# Patient Record
Sex: Female | Born: 1995 | Race: White | Hispanic: No | Marital: Single | Smoking: Never smoker
Health system: Southern US, Community
[De-identification: ages and names within clinical notes are randomized; demographics above are authoritative.]

## PROBLEM LIST (undated history)

## (undated) DIAGNOSIS — N390 Urinary tract infection, site not specified: Secondary | ICD-10-CM

## (undated) DIAGNOSIS — E669 Obesity, unspecified: Secondary | ICD-10-CM

## (undated) DIAGNOSIS — N920 Excessive and frequent menstruation with regular cycle: Secondary | ICD-10-CM

## (undated) HISTORY — DX: Urinary tract infection, site not specified: N39.0

## (undated) HISTORY — PX: TONSILECTOMY, ADENOIDECTOMY, BILATERAL MYRINGOTOMY AND TUBES: SHX2538

## (undated) HISTORY — DX: Obesity, unspecified: E66.9

## (undated) HISTORY — DX: Excessive and frequent menstruation with regular cycle: N92.0

---
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
## 2014-06-07 LAB — COMPREHENSIVE METABOLIC PANEL
Albumin: 3.5 g/dL — ABNORMAL LOW (ref 3.8–5.6)
Alkaline Phosphatase: 67 U/L
Anion Gap: 9 (ref 7–16)
BUN: 12 mg/dL (ref 7–18)
Bilirubin,Total: 0.5 mg/dL (ref 0.2–1.0)
CALCIUM: 8.7 mg/dL — AB (ref 9.0–10.7)
CO2: 23 mmol/L (ref 21–32)
CREATININE: 1.11 mg/dL (ref 0.60–1.30)
Chloride: 106 mmol/L (ref 98–107)
EGFR (African American): >60
GLUCOSE: 84 mg/dL (ref 65–99)
OSMOLALITY: 275 (ref 275–301)
Potassium: 4.1 mmol/L (ref 3.5–5.1)
SGOT(AST): 25 U/L (ref 0–26)
SGPT (ALT): 18 U/L
SODIUM: 138 mmol/L (ref 136–145)
Total Protein: 7.1 g/dL (ref 6.4–8.6)

## 2014-06-07 LAB — CBC
HCT: 41.1 % (ref 35.0–47.0)
HGB: 13.3 g/dL (ref 12.0–16.0)
MCH: 27.6 pg (ref 26.0–34.0)
MCHC: 32.3 g/dL (ref 32.0–36.0)
MCV: 85 fL (ref 80–100)
Platelet: 295 10*3/uL (ref 150–440)
RBC: 4.81 10*6/uL (ref 3.80–5.20)
RDW: 14.1 % (ref 11.5–14.5)
WBC: 17.5 10*3/uL — AB (ref 3.6–11.0)

## 2014-06-08 ENCOUNTER — Ambulatory Visit: Payer: Self-pay | Admitting: Urology

## 2014-06-08 ENCOUNTER — Inpatient Hospital Stay: Payer: Self-pay | Admitting: Specialist

## 2014-06-08 LAB — URINALYSIS, COMPLETE
BILIRUBIN, UR: NEGATIVE
Glucose,UR: NEGATIVE mg/dL (ref 0–75)
KETONE: NEGATIVE
Leukocyte Esterase: NEGATIVE
Nitrite: POSITIVE
Ph: 5 (ref 4.5–8.0)
Protein: NEGATIVE
RBC,UR: 13 /HPF (ref 0–5)
SPECIFIC GRAVITY: 1.018 (ref 1.003–1.030)
Squamous Epithelial: 1

## 2014-06-09 LAB — BASIC METABOLIC PANEL
ANION GAP: 5 — AB (ref 7–16)
BUN: 8 mg/dL (ref 7–18)
CHLORIDE: 109 mmol/L — AB (ref 98–107)
Calcium, Total: 8.8 mg/dL — ABNORMAL LOW (ref 9.0–10.7)
Co2: 26 mmol/L (ref 21–32)
Creatinine: 1 mg/dL (ref 0.60–1.30)
EGFR (African American): >60
EGFR (Non-African Amer.): >60
GLUCOSE: 96 mg/dL (ref 65–99)
Osmolality: 278 (ref 275–301)
Potassium: 3.8 mmol/L (ref 3.5–5.1)
Sodium: 140 mmol/L (ref 136–145)

## 2014-06-09 LAB — CBC WITH DIFFERENTIAL/PLATELET
BASOS ABS: 0.1 10*3/uL (ref 0.0–0.1)
Basophil %: 0.7 %
EOS PCT: 3.9 %
Eosinophil #: 0.3 10*3/uL (ref 0.0–0.7)
HCT: 36.7 % (ref 35.0–47.0)
HGB: 11.9 g/dL — ABNORMAL LOW (ref 12.0–16.0)
Lymphocyte #: 3 10*3/uL (ref 1.0–3.6)
Lymphocyte %: 37 %
MCH: 28 pg (ref 26.0–34.0)
MCHC: 32.5 g/dL (ref 32.0–36.0)
MCV: 86 fL (ref 80–100)
Monocyte #: 0.4 x10 3/mm (ref 0.2–0.9)
Monocyte %: 5.5 %
NEUTROS PCT: 52.9 %
Neutrophil #: 4.3 10*3/uL (ref 1.4–6.5)
Platelet: 247 10*3/uL (ref 150–440)
RBC: 4.26 10*6/uL (ref 3.80–5.20)
RDW: 14.3 % (ref 11.5–14.5)
WBC: 8.1 10*3/uL (ref 3.6–11.0)

## 2014-06-09 LAB — MAGNESIUM: MAGNESIUM: 1.7 mg/dL — AB

## 2014-06-09 LAB — URINE CULTURE

## 2014-08-05 ENCOUNTER — Encounter: Payer: Self-pay | Admitting: Family Medicine

## 2014-08-05 ENCOUNTER — Ambulatory Visit (INDEPENDENT_AMBULATORY_CARE_PROVIDER_SITE_OTHER): Payer: Self-pay | Admitting: Family Medicine

## 2014-08-05 ENCOUNTER — Encounter (INDEPENDENT_AMBULATORY_CARE_PROVIDER_SITE_OTHER): Payer: Self-pay

## 2014-08-05 VITALS — BP 124/74 | HR 95 | Temp 98.0°F | Ht 65.5 in | Wt 283.5 lb

## 2014-08-05 DIAGNOSIS — L709 Acne, unspecified: Secondary | ICD-10-CM

## 2014-08-05 MED ORDER — MINOCYCLINE HCL 50 MG PO CAPS: 50.0000 mg | ORAL_CAPSULE | Freq: Two times a day (BID) | ORAL | Status: DC

## 2014-08-05 MED ORDER — PHENTERMINE HCL 15 MG PO CAPS: 15.0000 mg | ORAL_CAPSULE | ORAL | Status: DC

## 2014-08-05 NOTE — Progress Notes (Signed)
Pre visit review using our clinic review tool, if applicable. No additional management support is needed unless otherwise documented below in the visit note. 

## 2014-08-05 NOTE — Assessment & Plan Note (Signed)
Persistent. Will refer to dermatology. Start minocycline 50 mg twice daily. The patient indicates understanding of these issues and agrees with the plan.

## 2014-08-05 NOTE — Assessment & Plan Note (Signed)
Discussed weight loss plan. She is declining nutritionist referral at this time. Pt would also like to discuss medication options- discussed phentermine risk benefits, side effects including HTN, pulmonary HTN, stroke.    She would like to start phentermine and lifestyle changes.   Has IUD.  Follow up in 1 month.  If BMI < 27 will decrease to half dose x 1 month then stop

## 2014-08-05 NOTE — Patient Instructions (Addendum)
Good to see you. We are starting minocycline 50 mg twice daily.  Please stop by to see Shirlee LimerickMarion on your way out to set up your dermatology referral.  We are starting phentermine 15 mg daily- please come see me in one month.

## 2014-08-05 NOTE — Progress Notes (Signed)
   Subjective:   Patient ID: Sheena Tapia, female    DOB: 1996-08-31, 18 y.o.   MRN: [id number]  Sheena Tapia is a pleasant 18 y.o. year old female who presents to clinic today with Annual Exam  on 08/05/2014  HPI: 18 yo pleasant female here to establish care.  Acne- has really effected her quality of life.  Only on her face, started when she had implanon.  Now has IUD, has improved a little but not much.  She could not tolerate topical retin-A- caused skin dryness.  Does not feel skin is that oily- using OTC facial washes.   Obesity- lifelong issue for her.  She is working on her diet, trying to increase physical activity but has not seen much success.  No current outpatient prescriptions on file prior to visit.   No current facility-administered medications on file prior to visit.    Allergies  Allergen Reactions  . Penicillins Rash    Past Medical History  Diagnosis Date  . Childhood obesity   . Menorrhagia     Past Surgical History  Procedure Laterality Date  . Tonsilectomy, adenoidectomy, bilateral myringotomy and tubes      Family History  Problem Relation Age of Onset  . Diabetes Maternal Grandfather     History   Social History  . Marital Status: Single    Spouse Name: N/A    Number of Children: N/A  . Years of Education: N/A   Occupational History  . Not on file.   Social History Main Topics  . Smoking status: Never Smoker   . Smokeless tobacco: Not on file  . Alcohol Use: Not on file  . Drug Use: Not on file  . Sexual Activity: Not on file   Other Topics Concern  . Not on file   Social History Narrative   Lives with mom, carolyn Treichler.   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   The PMH, PSH, Social History, Family History, Medications, and allergies have been [facility] in [facility], and have been updated if relevant.   Review of Systems  Constitutional: Negative.   HENT: Negative.   Eyes: Negative.   Respiratory: Negative.   Cardiovascular:  Negative.   Gastrointestinal: Negative.   Musculoskeletal: Negative.   Hematological: Negative.   Psychiatric/Behavioral: Negative.   All other systems reviewed and are negative.      Objective:     Physical Exam  Constitutional: She appears well-developed and well-nourished.  obese  HENT:  Head: Normocephalic.  Cardiovascular: Normal rate.   Pulmonary/Chest: Effort normal.  Musculoskeletal: Normal range of motion.  Skin:  +acne, cystic  Psychiatric: She has a normal mood and affect. Her behavior is normal. Judgment and thought content normal.  Nursing note and vitals reviewed.  BP 124/74 mmHg  Pulse 95  Temp(Src) 98 F (36.7 C) (Oral)  Ht 5' 5.5" (1.664 m)  Wt 283 lb 8 oz (128.595 kg)  BMI 46.44 kg/m2  SpO2 98%        Assessment & Plan:   Acne, unspecified acne type - Plan: Ambulatory referral to Dermatology No Follow-up on file.

## 2014-09-02 ENCOUNTER — Ambulatory Visit (INDEPENDENT_AMBULATORY_CARE_PROVIDER_SITE_OTHER): Payer: 59 | Admitting: Family Medicine

## 2014-09-02 ENCOUNTER — Encounter: Payer: Self-pay | Admitting: Family Medicine

## 2014-09-02 ENCOUNTER — Encounter: Payer: BC Managed Care – PPO | Admitting: Family Medicine

## 2014-09-02 DIAGNOSIS — L709 Acne, unspecified: Secondary | ICD-10-CM

## 2014-09-02 MED ORDER — MINOCYCLINE HCL 50 MG PO CAPS: 50.0000 mg | ORAL_CAPSULE | Freq: Two times a day (BID) | ORAL | Status: DC

## 2014-09-02 MED ORDER — PHENTERMINE HCL 30 MG PO CAPS: 30.0000 mg | ORAL_CAPSULE | ORAL | Status: DC

## 2014-09-02 NOTE — Progress Notes (Signed)
Pre visit review using our clinic review tool, if applicable. No additional management support is needed unless otherwise documented below in the visit note. 

## 2014-09-02 NOTE — Assessment & Plan Note (Signed)
Improved.   Continue minocyclin as directed.

## 2014-09-02 NOTE — Progress Notes (Signed)
Subjective:   Patient ID: Sheena Tapia, female    DOB: February 28, 1996, 18 y.o.   MRN: [id number]  Sheena Tapia is a pleasant 18 y.o. year old female who presents to clinic today with Follow-up  on 09/02/2014  HPI:  Established care with me last month.  Acne- has really effected her quality of life.  Only on her face, started when she had implanon.  Now has IUD, has improved a little but not much.  She could not tolerate topical retin-A- caused skin dryness.  Started minocycline twice daily last month and she has noticed a significant difference in her skin.  Obesity- lifelong issue for her.  She is working on her diet, trying to increase physical activity but has not seen much success because she works all day and going to school at night.  We started phentermine 15 mg daily one month ago.  For first two weeks, notice an improvement in her appetite but since then, she has not felt it has helped.  Has unfortunately actually gained weight. Denies any CP, HA, blurred vision, SOB, palpitations or insomnia.   Current Outpatient Prescriptions on File Prior to Visit  Medication Sig Dispense Refill  . levonorgestrel (MIRENA) 20 MCG/24HR IUD 1 each by Intrauterine route once.     No current facility-administered medications on file prior to visit.    Allergies  Allergen Reactions  . Penicillins Rash    Past Medical History  Diagnosis Date  . Childhood obesity   . Menorrhagia     Past Surgical History  Procedure Laterality Date  . Tonsilectomy, adenoidectomy, bilateral myringotomy and tubes      Family History  Problem Relation Age of Onset  . Diabetes Maternal Grandfather     History   Social History  . Marital Status: Single    Spouse Name: N/A    Number of Children: N/A  . Years of Education: N/A   Occupational History  . Not on file.   Social History Main Topics  . Smoking status: Never Smoker   . Smokeless tobacco: Not on file  . Alcohol Use: Not on file  . Drug  Use: Not on file  . Sexual Activity: Not on file   Other Topics Concern  . Not on file   Social History Narrative   Lives with mom, carolyn Knecht.   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   The PMH, PSH, Social History, Family History, Medications, and allergies [facility], and have been updated if relevant.   Review of Systems  Constitutional: Negative.   HENT: Negative.   Eyes: Negative.   Respiratory: Negative.   Cardiovascular: Negative.   Gastrointestinal: Negative.   Musculoskeletal: Negative.   Hematological: Negative.   Psychiatric/Behavioral: Negative.   All other systems reviewed and are negative.      Objective:     Physical Exam  Constitutional: She is oriented to person, place, and time. She appears well-developed and well-nourished.  obese  HENT:  Head: Normocephalic.  Cardiovascular: Normal rate.   Pulmonary/Chest: Effort normal.  Musculoskeletal: Normal range of motion.  Neurological: She is alert and oriented to person, place, and time. Coordination normal.  Skin: Skin is warm and dry.  +acne, but far less than 1 month ago  Psychiatric: She has a normal mood and affect. Her behavior is normal. Judgment and thought content normal.  Nursing note and vitals reviewed.  BP 114/74 mmHg  Pulse 85  Temp(Src) 98.1 F (36.7 C) (Oral)  Wt 287 lb (130.182 kg)  SpO2 97%        Assessment & Plan:   Severe obesity (BMI >= 40)  Acne, unspecified acne type No Follow-up on file.

## 2014-09-02 NOTE — Assessment & Plan Note (Signed)
Deteriorated. >25 minutes spent in face to face time with patient, >50% spent in counselling or coordination of care We discussed how important her portion sizes are and limiting snacking along with making time for exercise.  She is discouraged about her weight currently but wants to try harder.  She wants to try to increase phentermine to 30 mg daily and follow up in 1 month. She does not feel she has time to see a nutritionist currently since she is working and going to school.

## 2014-09-02 NOTE — Patient Instructions (Signed)
Great to see you. Try to wake up even earlier to exercise for 30 minutes.  Please come see me in one month.

## 2014-10-07 ENCOUNTER — Ambulatory Visit: Payer: 59 | Admitting: Family Medicine

## 2014-11-04 ENCOUNTER — Encounter: Payer: Self-pay | Admitting: Family Medicine

## 2014-11-04 ENCOUNTER — Ambulatory Visit (INDEPENDENT_AMBULATORY_CARE_PROVIDER_SITE_OTHER): Payer: 59 | Admitting: Family Medicine

## 2014-11-04 VITALS — BP 120/80 | HR 89 | Temp 98.6°F | Ht 65.5 in | Wt 285.8 lb

## 2014-11-04 DIAGNOSIS — J029 Acute pharyngitis, unspecified: Secondary | ICD-10-CM

## 2014-11-04 LAB — POCT RAPID STREP A (OFFICE): Rapid Strep A Screen: NEGATIVE

## 2014-11-04 MED ORDER — CEFDINIR 300 MG PO CAPS: 600.0000 mg | ORAL_CAPSULE | Freq: Every day | ORAL | Status: DC

## 2014-11-04 NOTE — Progress Notes (Signed)
Pre visit review using our clinic review tool, if applicable. No additional management support is needed unless otherwise documented below in the visit note. 

## 2014-11-04 NOTE — Progress Notes (Signed)
   Dr. Karleen HampshireSpencer T. Viktoria Gruetzmacher, MD, CAQ Sports Medicine Primary [facility] and Sports Medicine [address] [place], [id number] Phone: [phone] Fax: [phone]  11/04/2014  Patient: Sheena Tapia, MRN: [id number], DOB: [birth year], 19 y.o.  Primary Physician:  Ruthe Mannanalia Aron, MD  Chief Complaint: Sore Throat and Shortness of Breath  Subjective:   Sheena Tapia is a 19 y.o. very pleasant female patient who presents with the following:  Pleasant young woman who presents with a acute sore throat.  She was actually treated with a ten-day course of penicillin V quite recently, and took her last day of antibiotics 3 days ago.  She has had a return of her sore throat.  When she was initially evaluated for this she did have a confirmed positive strep test.  Past Medical History, Surgical History, Social History, Family History, Problem List, Medications, and Allergies have been reviewed and updated if relevant.  ROS: GEN: Acute illness details above GI: Tolerating PO intake GU: maintaining adequate hydration and urination Pulm: No SOB Interactive and getting along well at home.  Otherwise, ROS is as per the HPI.   Objective:   BP 120/80 mmHg  Pulse 89  Temp(Src) 98.6 F (37 C) (Oral)  Ht 5' 5.5" (1.664 m)  Wt 285 lb 12 oz (129.615 kg)  BMI 46.81 kg/m2   Gen: WDWN, NAD; A & O x3, cooperative. Pleasant.Globally Non-toxic HEENT: Normocephalic and atraumatic. Throat: normal R TM clear, L TM - good landmarks, No fluid present. rhinnorhea. No frontal or maxillary sinus T. MMM NECK: Anterior cervical  LAD is present - TTP mildly CV: RRR, No M/G/R, cap refill <2 sec PULM: Breathing comfortably in no respiratory distress. no wheezing, crackles, rhonchi ABD: S,NT,ND,+BS. No HSM. No rebound. EXT: No c/c/e PSYCH: Friendly, good eye contact    Laboratory and Imaging Data: Results for orders placed or performed in visit on 11/04/14  POCT rapid strep A  Result Value Ref Range   Rapid Strep  A Screen Negative Negative     Assessment and Plan:   Sore throat - Plan: POCT rapid strep A  In a patient with recent diagnosis of strep, confirmed, extremely high pretest probability for treatment failure streptococcal pharyngitis.  Treat with Omnicef.  Our negative strep test means very little, given that the patient 2 or 3 days ago was on penicillin.  Follow-up: Return if symptoms worsen or fail to improve.  New Prescriptions   CEFDINIR (OMNICEF) 300 MG CAPSULE    Take 2 capsules (600 mg total) by mouth daily.   Orders Placed This Encounter  Procedures  . POCT rapid strep A    Signed,  Jaqulyn Chancellor T. Otis Portal, MD   Patient's Medications  New Prescriptions   CEFDINIR (OMNICEF) 300 MG CAPSULE    Take 2 capsules (600 mg total) by mouth daily.  Previous Medications   LEVONORGESTREL (MIRENA) 20 MCG/24HR IUD    1 each by Intrauterine route once.   PHENTERMINE 30 MG CAPSULE    Take 1 capsule (30 mg total) by mouth every morning.  Modified Medications   No medications on file  Discontinued Medications   MINOCYCLINE (MINOCIN,DYNACIN) 50 MG CAPSULE    Take 1 capsule (50 mg total) by mouth 2 (two) times daily.

## 2014-12-05 ENCOUNTER — Ambulatory Visit: Payer: 59 | Admitting: Family Medicine

## 2014-12-24 ENCOUNTER — Encounter: Payer: Self-pay | Admitting: Family Medicine

## 2014-12-24 ENCOUNTER — Ambulatory Visit (INDEPENDENT_AMBULATORY_CARE_PROVIDER_SITE_OTHER): Payer: 59 | Admitting: Family Medicine

## 2014-12-24 VITALS — BP 116/70 | HR 88 | Temp 97.9°F | Wt 277.5 lb

## 2014-12-24 DIAGNOSIS — R5383 Other fatigue: Secondary | ICD-10-CM | POA: Insufficient documentation

## 2014-12-24 DIAGNOSIS — R42 Dizziness and giddiness: Secondary | ICD-10-CM

## 2014-12-24 LAB — CBC WITH DIFFERENTIAL/PLATELET
Basophils Absolute: 0.1 10*3/uL (ref 0.0–0.1)
Basophils Relative: 0.6 % (ref 0.0–3.0)
Eosinophils Absolute: 0.3 10*3/uL (ref 0.0–0.7)
Eosinophils Relative: 2.4 % (ref 0.0–5.0)
HCT: 44 % (ref 36.0–46.0)
HEMOGLOBIN: 14.8 g/dL (ref 12.0–15.0)
Lymphocytes Relative: 30.2 % (ref 12.0–46.0)
Lymphs Abs: 3.5 10*3/uL (ref 0.7–4.0)
MCHC: 33.8 g/dL (ref 30.0–36.0)
MCV: 84.9 fl (ref 78.0–100.0)
Monocytes Absolute: 0.6 10*3/uL (ref 0.1–1.0)
Monocytes Relative: 5 % (ref 3.0–12.0)
Neutro Abs: 7.1 10*3/uL (ref 1.4–7.7)
Neutrophils Relative %: 61.8 % (ref 43.0–77.0)
PLATELETS: 310 10*3/uL (ref 150.0–400.0)
RBC: 5.18 Mil/uL — AB (ref 3.87–5.11)
RDW: 13.4 % (ref 11.5–14.6)
WBC: 11.5 10*3/uL — ABNORMAL HIGH (ref 4.5–10.5)

## 2014-12-24 LAB — COMPREHENSIVE METABOLIC PANEL
ALK PHOS: 56 U/L (ref 39–117)
ALT: 15 U/L (ref 0–35)
AST: 17 U/L (ref 0–37)
Albumin: 4.3 g/dL (ref 3.5–5.2)
BILIRUBIN TOTAL: 0.5 mg/dL (ref 0.2–1.2)
BUN: 11 mg/dL (ref 6–23)
CO2: 28 meq/L (ref 19–32)
CREATININE: 0.9 mg/dL (ref 0.40–1.20)
Calcium: 9.6 mg/dL (ref 8.4–10.5)
Chloride: 103 mEq/L (ref 96–112)
GFR: 84.45 mL/min (ref >60.00)
Glucose, Bld: 69 mg/dL — ABNORMAL LOW (ref 70–99)
POTASSIUM: 4.2 meq/L (ref 3.5–5.1)
Sodium: 137 mEq/L (ref 135–145)
TOTAL PROTEIN: 7.5 g/dL (ref 6.0–8.3)

## 2014-12-24 LAB — HEMOGLOBIN A1C: HEMOGLOBIN A1C: 4.9 % (ref 4.6–6.5)

## 2014-12-24 LAB — TSH: TSH: 1.99 u[IU]/mL (ref 0.35–5.50)

## 2014-12-24 LAB — VITAMIN B12: VITAMIN B 12: 190 pg/mL — AB (ref 211–911)

## 2014-12-24 NOTE — Assessment & Plan Note (Signed)
New- intermittent.  She does take high risk medications - phentermine, IUD. I did advise that she take a drug holiday from phentermine to see if her symptoms change. Once we have lab work results, will reassess and discuss what further work up should be done next. The patient indicates understanding of these issues and agrees with the plan.

## 2014-12-24 NOTE — Patient Instructions (Signed)
Good to see you. We will call you with your lab results.   

## 2014-12-24 NOTE — Progress Notes (Signed)
Subjective:   Patient ID: Sheena Tapia, female    DOB: 31-Jul-1996, 18 y.o.   MRN: [id number]  Sheena Tapia is a pleasant 19 y.o. year old female who presents to clinic today with Fatigue  on 12/24/2014  HPI:  Here for several months (? 6) of fatigue and intermittent dizziness.  Currently taking phentermine but she feels her symptoms are unrelated and actually she feels less tired since she started taking it.  Dizziness seems to only occur when she goes long periods of time without eating.  She has a family history of diabetes and is concerned that she may be developing diabetes.  No syncope.  No CP.  No recent illnesses.  Current Outpatient Prescriptions on File Prior to Visit  Medication Sig Dispense Refill  . levonorgestrel (MIRENA) 20 MCG/24HR IUD 1 each by Intrauterine route once.    . phentermine 30 MG capsule Take 1 capsule (30 mg total) by mouth every morning. 30 capsule 0   No current facility-administered medications on file prior to visit.    No Known Allergies  Past Medical History  Diagnosis Date  . Childhood obesity   . Menorrhagia     Past Surgical History  Procedure Laterality Date  . Tonsilectomy, adenoidectomy, bilateral myringotomy and tubes      Family History  Problem Relation Age of Onset  . Diabetes Maternal Grandfather     History   Social History  . Marital Status: Single    Spouse Name: N/A  . Number of Children: N/A  . Years of Education: N/A   Occupational History  . Not on file.   Social History Main Topics  . Smoking status: Never Smoker   . Smokeless tobacco: Never Used  . Alcohol Use: No  . Drug Use: No  . Sexual Activity: Not on file   Other Topics Concern  . Not on file   Social History Narrative   Lives with mom, carolyn Olmo.   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   The PMH, PSH, Social History, Family History, Medications, and allergies [facility], and have been updated  if relevant.   Review of Systems  Constitutional: Positive for fatigue. Negative for fever.  HENT: Negative.   Eyes: Negative.   Respiratory: Negative.   Cardiovascular: Negative.   Gastrointestinal: Negative.   Endocrine: Negative.   Genitourinary: Negative.   Musculoskeletal: Negative.   Skin: Negative.   Allergic/Immunologic: Negative.   Neurological: Positive for dizziness and light-headedness. Negative for tremors, seizures, syncope, facial asymmetry, speech difficulty, weakness, numbness and headaches.  Hematological: Negative for adenopathy.  Psychiatric/Behavioral: Negative.   All other systems reviewed and are negative.      Objective:    BP 116/70 mmHg  Pulse 105  Temp(Src) 97.9 F (36.6 C) (Oral)  Wt 277 lb 8 oz (125.873 kg)  SpO2 97% Wt Readings from Last 3 Encounters:  12/24/14 277 lb 8 oz (125.873 kg)  11/04/14 285 lb 12 oz (129.615 kg)  09/02/14 287 lb (130.182 kg)     Physical Exam  Constitutional: She is oriented to person, place, and time. She appears well-developed and well-nourished. No distress.  obese  HENT:  Head: Normocephalic.  Eyes: Conjunctivae are normal.  Neck: Normal range of motion. Neck supple. No thyromegaly present.  Cardiovascular: Normal rate and regular rhythm.   Pulmonary/Chest: Effort normal and breath sounds normal. No respiratory distress.  Musculoskeletal: She exhibits no edema.  Lymphadenopathy:  She has no cervical adenopathy.  Neurological: She is alert and oriented to person, place, and time. No cranial nerve deficit.  Skin: Skin is warm and dry.  Psychiatric: She has a normal mood and affect. Her behavior is normal. Judgment and thought content normal.  Nursing note and vitals reviewed.         Assessment & Plan:   Other fatigue No Follow-up on file.

## 2014-12-24 NOTE — Progress Notes (Signed)
Pre visit review using our clinic review tool, if applicable. No additional management support is needed unless otherwise documented below in the visit note. 

## 2014-12-24 NOTE — Assessment & Plan Note (Signed)
New problem but has been ongoing for past several months. Advised that she needs to start exercising and that increased physical activity and weight loss will improve her energy level. I do agree that we do need to do some blood work to rule out other possible contributing factors.  Orders Placed This Encounter  Procedures  . TSH  . CBC with Differential/Platelet  . Comprehensive metabolic panel  . Hemoglobin A1c  . Vitamin B12

## 2015-01-18 NOTE — Consult Note (Signed)
Admit Diagnosis:   PYELONEPHRITIS: Onset Date: 08-Jun-2014, Status: Active, Description: PYELONEPHRITIS      Admit Reason:   Pyelonephritis (590.80): Status: Active, Coding System: ICD9, Coded Name: Pyelonephritis, unspecified  Home Medications: Medication Status  patient takes no medicine Active   Lab Results: Hepatic:  11-Sep-15 21:28   Bilirubin, Total 0.5  Alkaline Phosphatase 67 (46-116 NOTE: New Reference Range 04/16/14)  SGPT (ALT) 18 (14-63 NOTE: New Reference Range 04/16/14)  SGOT (AST) 25  Total Protein, Serum 7.1  Albumin, Serum  3.5  Routine Micro:  11-Sep-15 21:05   Micro Text Report URINE CULTURE   COMMENT                   COLONIES TOO SMALL TO READ   ANTIBIOTIC                       Specimen Source CLEAN CATCH  Culture Comment COLONIES TOO SMALL TO READ  Result(s) reported on 08 Jun 2014 at 11:15AM.  Routine Chem:  11-Sep-15 21:05   Result Comment UA - CANCEL/QNS ON SPECIMEN/NEEDS RECOLLECT  - C/RAQUEL DAVID/2200/06-07-14/RWW  Result(s) reported on 07 Jun 2014 at 10:00PM.    21:28   Glucose, Serum 84  BUN 12  Creatinine (comp) 1.11  Sodium, Serum 138  Potassium, Serum 4.1  Chloride, Serum 106  CO2, Serum 23  Calcium (Total), Serum  8.7  Osmolality (calc) 275  eGFR (African American) >60  eGFR (Non-African American) >60 (eGFR values <26mL/min/1.73 m2 may be an indication of chronic kidney disease (CKD). Calculated eGFR is useful in patients with stable renal function. The eGFR calculation will not be reliable in acutely ill patients when serum creatinine is changing rapidly. It is not useful in  patients on dialysis. The eGFR calculation may not be applicable to patients at the low and high extremes of body sizes, pregnant women, and vegetarians.)  Result Comment POTASSIUM/AST/CREATININE - Slight hemolysis, interpret results with  - caution.  Result(s) reported on 07 Jun 2014 at 09:55PM.  Anion Gap 9  Routine UA:  11-Sep-15 21:05    Color (UA) -  Clarity (UA) -  Glucose (UA) -  Bilirubin (UA) -  Ketones (UA) -  Specific Gravity (UA) -  Blood (UA) -  pH (UA) -  Protein (UA) -  Nitrite (UA) -  Leukocyte Esterase (UA) -  RBC (UA) -  WBC (UA) -  Bacteria (UA) -  Epithelial Cells (UA) -  Mucous (UA) -  Other 1 (UA) -  Other 2 (UA) -  Other 3 (UA) -  Other 4 (UA) -  Other 5 (UA) -  Other 6 (UA) - (Result(s) reported on 07 Jun 2014 at 10:00PM.)  Transitional Epithelial (UA) -  Renal Epithelial (UA) -  WBC Clump (UA) -  Dysmorphic Red Blood Cell (UA) -  Red Blood Cell Clump (UA) -  Trichomonas (UA) -  Budding Yeast (UA) -  Hyphae Yeast -  Hyaline Cast (UA) -  Granular Cast (UA) -  Epithelial Cast (UA) -  WBC Cast (UA) -  RBC Cast (UA) -  Cellular Cast (UA) -  Broad Cast (UA) -  Waxy Cast (UA) -  Fatty Cast (UA) -  Amorphous Crystal (UA) -  Triple Phosphate Crystal (UA) -  Calcium Oxalate Crystal (UA) -  Uric Acid Crystal (UA) -  Calcium Phosphate Crystal (UA) -  Calcium Carbonate Crystal (UA) -  Cystine Crystal (UA) -  Leucine Crystal (UA) -  Tyrosine  Crystal (UA) -  Fat (UA) -  Oval Fat Body (UA) -  Routine Hem:  11-Sep-15 21:28   WBC (CBC)  17.5  RBC (CBC) 4.81  Hemoglobin (CBC) 13.3  Hematocrit (CBC) 41.1  Platelet Count (CBC) 295 (Result(s) reported on 07 Jun 2014 at 09:48PM.)  MCV 85  MCH 27.6  MCHC 32.3  RDW 14.1   Radiology Results:  Radiology Results: CT:    12-Sep-15 03:04, CT Abdomen Pelvis WO for Stone  CT Abdomen Pelvis WO for Stone  REASON FOR EXAM:    L flank pain, N/V, leukocytosis, concerned for   possibility of infected stone  COMMENTS:       PROCEDURE: CT  - CT ABDOMEN /PELVIS WO (STONE)  - Jun 08 2014  3:04AM     CLINICAL DATA:  Left lower quadrant flank pain, nausea and vomiting.  Leukocytosis.    EXAM:  CT ABDOMEN AND PELVIS WITHOUT CONTRAST    TECHNIQUE:  Multidetector CT imaging of the abdomen and pelvis was performed  following the standard  protocol without IV contrast.  COMPARISON:  None.    FINDINGS:  The visualized lung bases are clear.    The liver and spleen are unremarkable in appearance. The gallbladder  is within normal limits. The pancreas and adrenal glands are  unremarkable.    There is minimal left-sided hydronephrosis, with prominence of the  leftureter along its entire course. An apparent obstructing 3-4 mm  stone is noted distally at the left vesicoureteral junction. No  secondary findings are seen to suggest infection about the stone,  though mild pyelonephritis cannot be excluded on a noncontrast  study.  The right kidney is unremarkable in appearance. No nonobstructing  renal stones are identified.    No free fluid is identified. The small bowel is unremarkable in  appearance. The stomach is within normal limits. No acute vascular  abnormalities are seen.    The appendix is normal in caliber without evidence for appendicitis.  The colon is unremarkable in appearance.    The bladder is mildly distended and grossly unremarkable. The uterus  is unremarkable in appearance, with an intrauterine device noted in  expected position at the fundus of the uterus. The ovaries are  relatively symmetric. No suspicious adnexal masses are seen. No  inguinal lymphadenopathy is seen.  No acute osseous abnormalities are identified.     IMPRESSION:  Minimal left-sided hydronephrosis, with an apparent obstructing 3-4  mm stone noted distally at the left vesicoureteral junction. Mild  pyelonephritis cannot be excluded on a noncontrast study, and is a  concern given the patient's leukocytosis.      Electronically Signed    By: Garald Balding M.D.    On: 06/08/2014 03:32         Verified By: JEFFREY . CHANG, M.D.,    Rocephin: Rash  Nursing Flowsheets: **Vital Signs.:   12-Sep-15 05:13  Vital Signs Type Admission  Temperature Temperature (F) 97.6  Celsius 36.4  Temperature Source oral  Pulse Pulse 71   Respirations Respirations 20  Systolic BP Systolic BP 093  Diastolic BP (mmHg) Diastolic BP (mmHg) 60  Mean BP 79  Pulse Ox % Pulse Ox % 99  Pulse Ox Activity Level  At rest  Oxygen Delivery Room Air/ 21 %    Present Illness Patient is a 19 year old female, without a stone history who presented with 24 hr of left flank pain, nausea, and emesis.  Denied fever but reports some chills. She was afebrile on  admit and remains so.  She denies GU history or symptoms.  No family stone history.    CT of the abdomen and pelvis without showed evidence of minimal left-sided hydronephrosis with apparent obstructing 3 x 4 mm stone at the left vesicoureteral junction.   The patient was started on IV Levaquin in the ED.   Case History and Physical Exam:  Chief Complaint Abdominal Pain  Nausea/Vomiting   Past Medical Health Non-Contributory   Past Surgical History NCT   Family History no stones   HEENT PERLA   Neck/Nodes No Adenopathy   Chest/Lungs Clear   Cardiovascular Normal Sinus Rhythm   Abdomen Benign   Genitalia Not examined   Rectal Not examined   Musculoskeletal Full range of motion   Neurological Grossly WNL   Skin Warm    Impression 19 yo with obstructing 4 mm L UVJ stone and proximal hydro.  Admitted with leukocytosis.  Afebrile and pain controlled.  Options including ureteral stent vs. trial of passage were discussed.  Patient opts for continued trial of passage.   Plan - IVF/IV pain control - Abx per primary team - Tamsulosin - OOB and Reg Diet. - NPO at midnight - Stent tomorrow if pain worsens, febrile, or leukocytosis fails to resolve   Electronic Signatures: Felicity Coyer (MD)  (Signed 12-Sep-15 11:50)  Authored: Health Issues, Home Medications, Labs, Radiology Results, Allergies, Vital Signs, General Aspect/Present Illness, History and Physical Exam, Impression/Plan   Last Updated: 12-Sep-15 11:50 by Felicity Coyer (MD)

## 2015-01-18 NOTE — Discharge Summary (Signed)
PATIENT NAME:  Sheena Tapia, Sheena Tapia MR#:  [id number] DATE OF BIRTH:  [birth year]  DATE OF ADMISSION:  06/08/2014 DATE OF DISCHARGE:  06/09/2014  For a detailed note, please take a look at the history and physical done on admission by Dr. Randol KernElgergawy.  DIAGNOSES AT DISCHARGE:  1.  Acute pyelonephritis secondary to nephrolithiasis.  2.  Leukocytosis secondary to the pyelonephritis.  3.  Nephrolithiasis.   DIET:  The patient is being discharged on a regular diet.   ACTIVITY:  As tolerated.   FOLLOWUP:  With Dr. Olegario Shearerharles Scott in the next 1-2 weeks.   DISCHARGE MEDICATIONS:  Levaquin 500 mg daily x 5 days.   CONSULTANTS DURING THE HOSPITAL COURSE:  Tawni Millersdward R. Richardson LandryHouser, MD, from urology.   PERTINENT STUDIES DONE DURING THE HOSPITAL COURSE:  A CT scan of the abdomen and pelvis done which showed a minimal left-sided hydronephrosis with a 3-4-mm distally in the left vesicoureteral junction, mild pyelonephritis cannot be excluded.   HOSPITAL COURSE: This is a 19 year old female who presented to the hospital with left-sided abdominal pain and nausea and vomiting and CT scan findings suggestive of nephrolithiasis and acute pyelonephritis.  1.  Acute pyelonephritis. This was likely the cause of the patient's leukocytosis, abdominal pain, nausea, vomiting. The patient was started on supportive care with IV fluids, antiemetics, and IV antibiotics. The patient after getting aggressive therapy and since having passed her stone has clinically improved. Her white cell count has normalized. She is afebrile, tolerating p.o. well and therefore is empirically being discharged on oral Levaquin for 5 more days.  2.  Leukocytosis. This was likely secondary to the pyelonephritis. It has now normalized with IV antibiotic therapy.  3.  Nephrolithiasis. This was likely the cause of the patient's acute pyelonephritis. The patient had a small 3-4-mm vesicoureteral stone. She apparently passed a stone while in the hospital. Her  abdominal pain and clinical symptoms have improved. She was seen by urology who planned on doing an intervention if she were not able to pass the stone, but since she did she does not need any further urology followup at this point.   The patient is a full code.   DISPOSITION: She is being discharged home.   TIME SPENT: 40 minutes    ____________________________ Rolly PancakeVivek J. Cherlynn KaiserSainani, MD vjs:lt D: 06/09/2014 12:16:59 ET T: 06/09/2014 13:40:03 ET JOB#: [id number]  cc: Rolly PancakeVivek J. Cherlynn KaiserSainani, MD, <Dictator>             Hennie Duosharles K. Lorin PicketScott, MD Houston SirenVIVEK J Tyshaun Vinzant MD ELECTRONICALLY SIGNED 06/17/2014 9:40

## 2015-01-18 NOTE — H&P (Signed)
PATIENT NAME:  Sheena Tapia, MCEACHERN MR#:  [id number] DATE OF BIRTH:  May 02, 1996  DATE OF ADMISSION:  06/08/2014  REFERRING PHYSICIAN: Eartha Inch. York Cerise, MD  PRIMARY CARE PHYSICIAN: Dr. Clifton Custard at the ob/gyn office.   CHIEF COMPLAINT: Back pain.   HISTORY OF PRESENT ILLNESS: This is a 19 year old female, without significant past medical history, who presents with complaints of left-sided back pain that started yesterday evening. As well the patient denies any fever, but reports some chills. She reports some weakness. Denies any dysuria or polyuria. Upon presentation, the patient was noticed to have left CVA tenderness. Her urinalysis was positive with white count of 17,000; she was afebrile though.   The patient had a CT of the abdomen and pelvis without contrast for a stone, with evidence of minimal left-sided hydronephrosis with apparent obstructing 3 to 4 mm stones noted daily at the left vesicourethral junction.   The patient was started on IV Levaquin in the ED. She denies any abdominal pain, nausea or vomiting, fevers or chills, cough or productive sputum. The ED discussed with the urologist on call, who requested admission for IV fluids and antibiotics.   PAST MEDICAL HISTORY: None.   PAST SURGICAL HISTORY:  1.  Adenoidectomy.  2.  Ear tube surgery.   SOCIAL HISTORY: Lives at home with her family. No smoking. No alcohol use. No illicit drug use.    FAMILY HISTORY: Denies any family history of diabetes or hypertension.   ALLERGIES: ROCEPHIN CAUSING HIVES.   HOME MEDICATIONS: None.   REVIEW OF SYSTEMS:  CONSTITUTIONAL: Denies fever. Reports fatigue and weakness. Denies weight gain or weight loss.  EYES: Denies blurry vision, double vision or inflammation.  ENT: Denies tinnitus, ear pain, hearing loss, epistaxis.  RESPIRATORY: Denies cough, wheezing, COPD.  CARDIOVASCULAR: Denies chest pain, orthopnea, syncope.  GASTROINTESTINAL: Denies nausea, vomiting, diarrhea, abdominal pain,  hematemesis, melena.  GENITOURINARY: Denies dysuria, hematuria, or renal colic.  ENDOCRINE: Denies polyuria, polydipsia, heat or cold intolerance.  HEMATOLOGY: Denies anemia, easy bruising, bleeding diathesis.  INTEGUMENT: Denies acne, rash, or skin lesion.  MUSCULOSKELETAL: Reports left-sided back pain. Denies any arthritis or cramps.  NEUROLOGIC: Denies tremors, vertigo, ataxia, headache.  PSYCHIATRIC: Denies anxiety, insomnia, or depression.   PHYSICAL EXAMINATION:  VITAL SIGNS: Temperature 97.7, pulse 60, respiratory rate 16, blood pressure 122/66, saturating 98% on room air.  GENERAL: Well-nourished female who looks comfortable in bed, in no apparent distress.  HEENT: Head atraumatic, normocephalic. Pupils equal and reactive to light. Pink conjunctivae. Anicteric sclerae. Moist oral mucosa.  NECK: Supple. No thyromegaly. No JVD. No carotid bruits. Trachea is midline.  CHEST: Good air entry bilaterally. No wheezing, rales, or rhonchi. No use of accessory muscles.  CARDIOVASCULAR: S1, S2 heard. No murmurs, rubs, or gallops. PMI is nondisplaced.  ABDOMEN: Soft, nontender, nondistended. Bowel sounds present. No rebound. No guarding.  EXTREMITIES: No edema, no clubbing, no cyanosis. Pedal pulses +2 bilaterally.  MUSCULOSKELETAL: No joint effusion or erythema. Has left CVA tenderness, mild tenderness to palpation.  SKIN: Normal skin turgor. Warm and dry.  LYMPHATICS: No cervical or supraclavicular lymphadenopathy.   PERTINENT LABORATORY DATA: Glucose 84, BUN 12, creatinine 1.1, sodium 138, potassium 4.1, chloride 106. White blood cells 17.5, hemoglobin 13.3, hematocrit 41.1, platelets 295,000. Urinalysis positive for nitrites, 13 red blood cells, 3 white blood cells.   ASSESSMENT AND PLAN:  1.  Acute pyelonephritis. The patient presents with urinary tract infection, leukocytosis, and left costovertebral angle tenderness. She will be started on IV Levaquin. We will follow  on the urine  cultures.and adjust antibiotics if needed.  2.  Ureterolithiasis. The patient has evidence of a left ureteral stone with mild obstruction. ED physician discussed with urology, who will evaluate the patient in the morning. She was given 1 dose of Tamsulosin.  3.  Deep vein thrombosis prophylaxis. Subcutaneous heparin.   CODE STATUS: Full Code.   TOTAL TIME SPENT ON ADMISSION AND PATIENT CARE: 40 minutes    ____________________________ Starleen Armsawood S. Laron Boorman, MD dse:MT D: 06/08/2014 04:54:46 ET T: 06/08/2014 05:41:11 ET JOB#: [id number]  cc: Starleen Armsawood S. Kennice Finnie, MD, <Dictator> Kendal Ghazarian Teena IraniS Lemuel Boodram MD ELECTRONICALLY SIGNED 06/08/2014 23:48

## 2015-03-20 IMAGING — CT CT ABD-PELV W/O CM
3 of 4 series · 10 of 46 positions shown, 17 images · non-contrast
Comparison: None.

CLINICAL DATA: Left lower quadrant flank pain, nausea and vomiting.
Leukocytosis.

EXAM:
CT ABDOMEN AND PELVIS WITHOUT CONTRAST
TECHNIQUE: Multidetector CT imaging of the abdomen and pelvis was performed
following the standard protocol without IV contrast.

[Series 4: lung · axial · 0.85mm/px · z∈[-588,-484]mm · 6 of 31 slices shown, 11 images]
[im 5/31  soft-tissue]
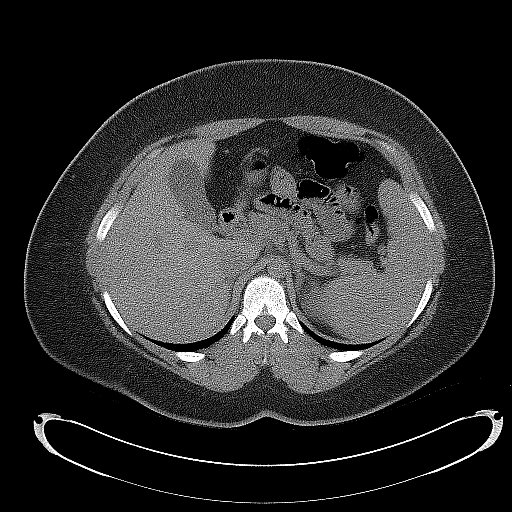
[im 5/31  bone]
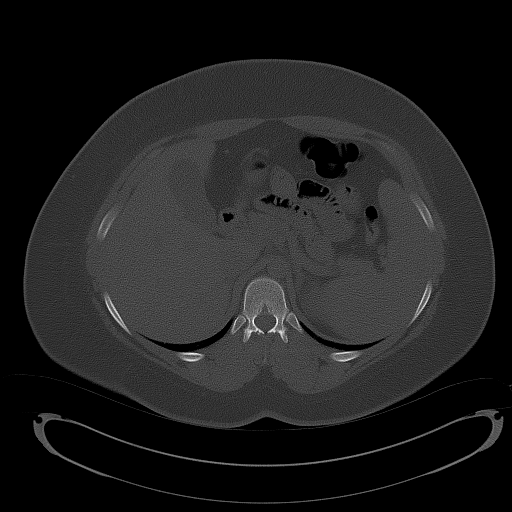
[im 9/31  soft-tissue]
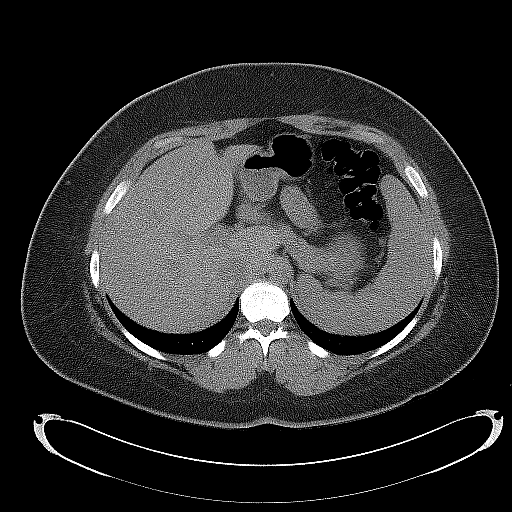
[im 13/31  soft-tissue]
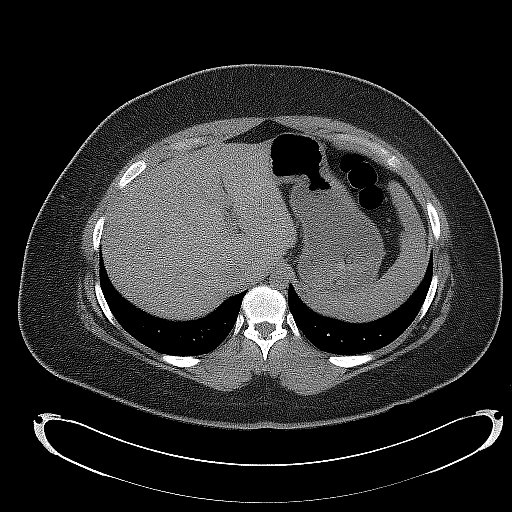
[im 13/31  lung]
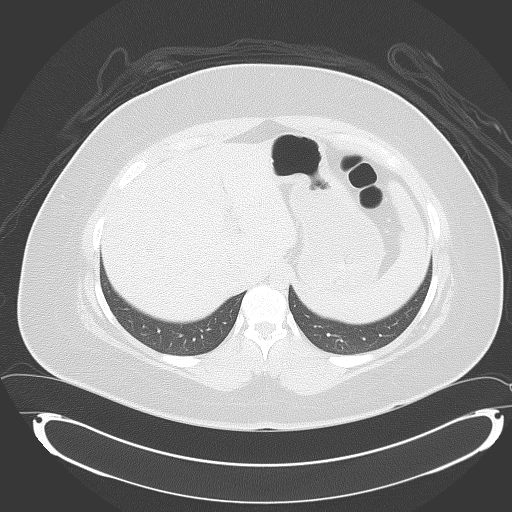
[im 18/31  soft-tissue]
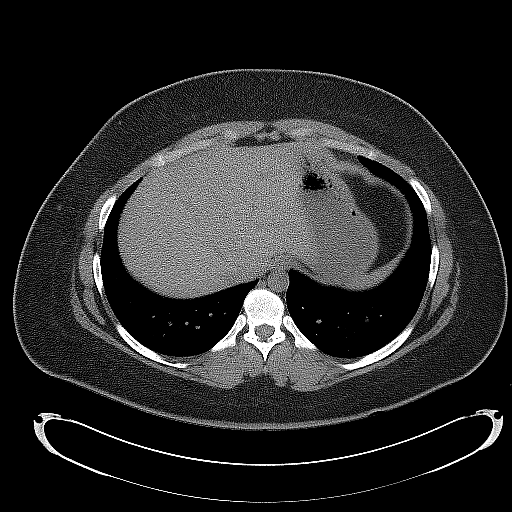
[im 18/31  lung]
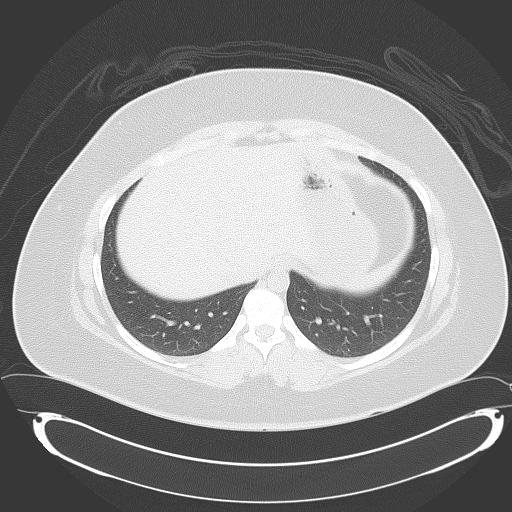
[im 22/31  soft-tissue]
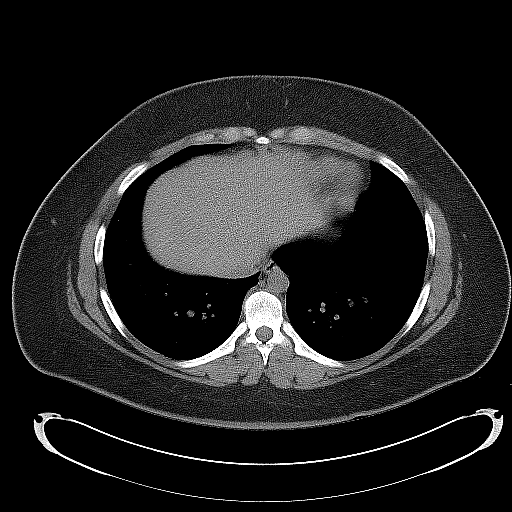
[im 22/31  lung]
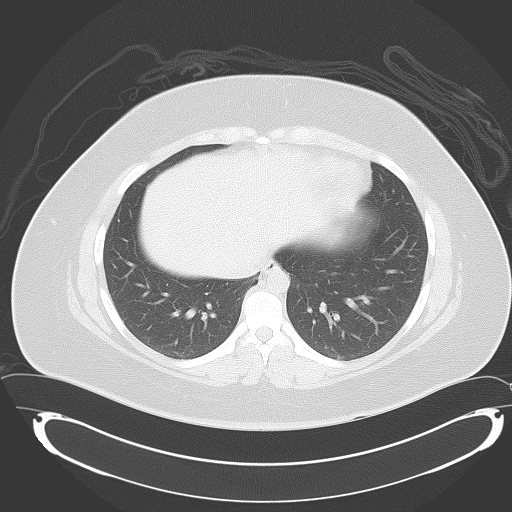
[im 26/31  soft-tissue]
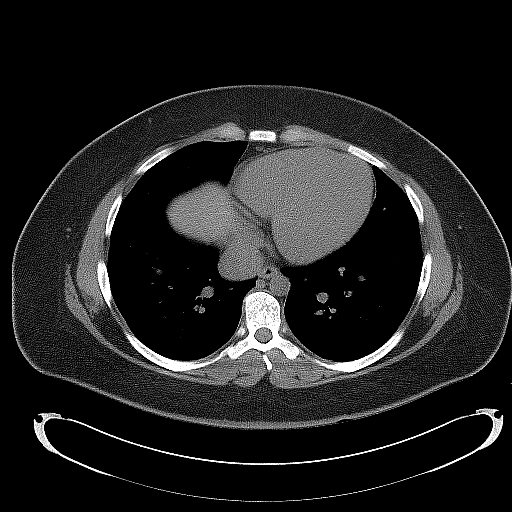
[im 26/31  lung]
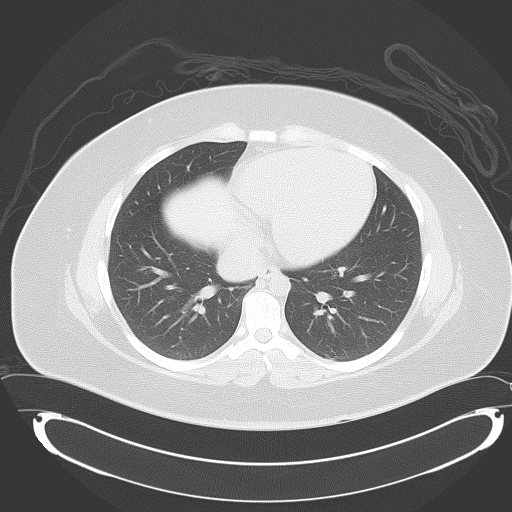

[Series 5: coronal · coronal · 1.01mm/px · 3 of 136 slices shown, 4 images]
[im 46/136  soft-tissue]
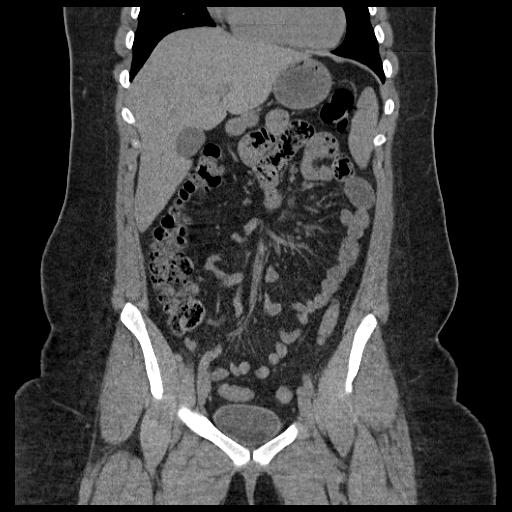
[im 61/136  soft-tissue]
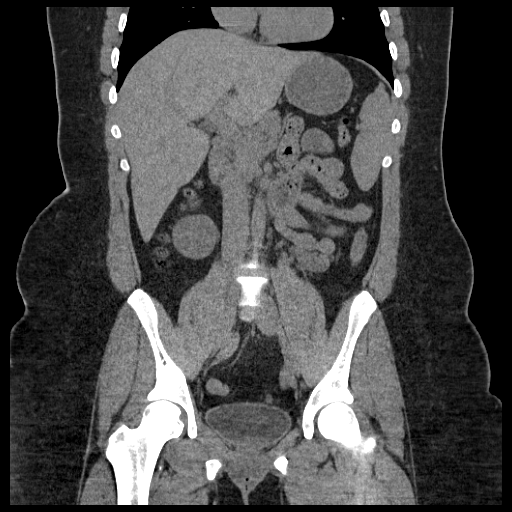
[im 61/136  bone]
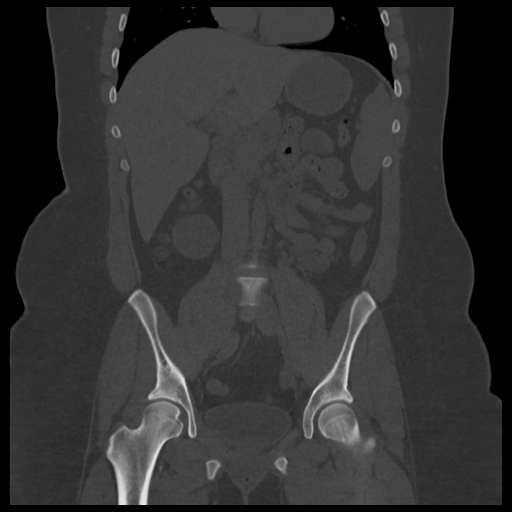
[im 76/136  soft-tissue]
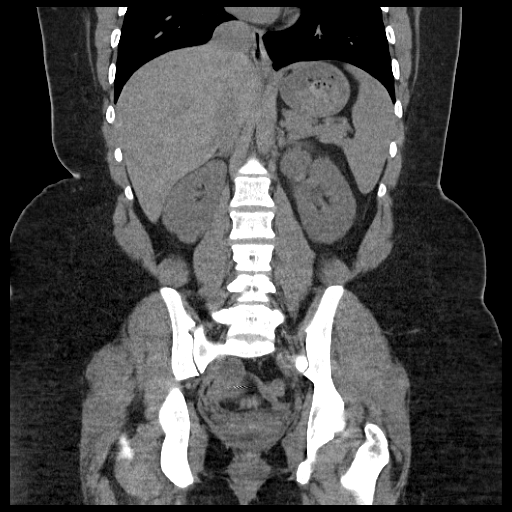

[Series 6: sagittal · sagittal · 1.01mm/px · 1 of 182 slices shown, 2 images]
[im 61/182  soft-tissue]
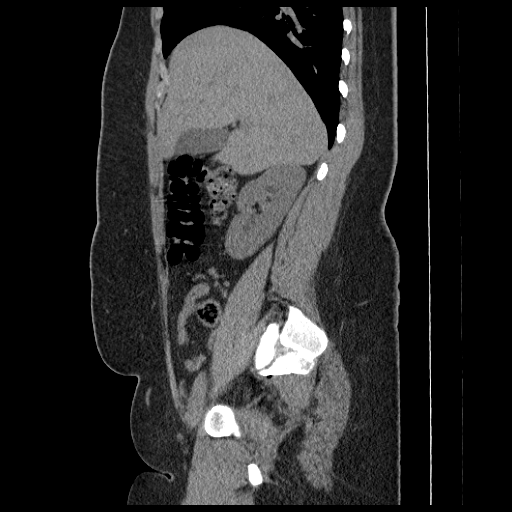
[im 61/182  bone]
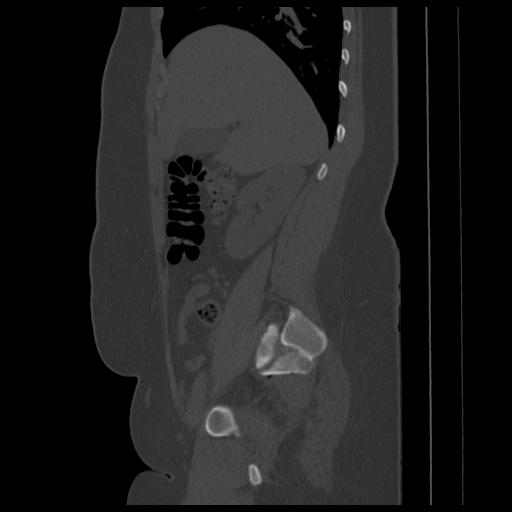

[10 of 46 positions shown; findings below may reference images not displayed]

FINDINGS: The visualized lung bases are clear.

The liver and spleen are unremarkable in appearance. The gallbladder
is within normal limits. The pancreas and adrenal glands are
unremarkable.

There is minimal left-sided hydronephrosis, with prominence of the
left ureter along its entire course. An apparent obstructing 3-4 mm
stone is noted distally at the left vesicoureteral junction. No
secondary findings are seen to suggest infection about the stone,
though mild pyelonephritis cannot be excluded on a noncontrast
study.

The right kidney is unremarkable in appearance. No nonobstructing
renal stones are identified.

No free fluid is identified. The small bowel is unremarkable in
appearance. The stomach is within normal limits. No acute vascular
abnormalities are seen.

The appendix is normal in caliber without evidence for appendicitis.
The colon is unremarkable in appearance.

The bladder is mildly distended and grossly unremarkable. The uterus
is unremarkable in appearance, with an intrauterine device noted in
expected position at the fundus of the uterus. The ovaries are
relatively symmetric. No suspicious adnexal masses are seen. No
inguinal lymphadenopathy is seen.

No acute osseous abnormalities are identified.
IMPRESSION: Minimal left-sided hydronephrosis, with an apparent obstructing 3-4
mm stone noted distally at the left vesicoureteral junction. Mild
pyelonephritis cannot be excluded on a noncontrast study, and is a
concern given the patient's leukocytosis.

## 2016-12-16 ENCOUNTER — Telehealth: Payer: Self-pay | Admitting: Obstetrics and Gynecology

## 2016-12-16 NOTE — Telephone Encounter (Signed)
Pt is calling for an Skyla removal and reinsertion for 01/17/17 at 2:10 with Alicia Copland. Pt also work like to know if she will be given the medcation she need to take right before her removal. Pt use [facility] on [address] CB# [phone]

## 2016-12-16 NOTE — Telephone Encounter (Addendum)
Pt hasn't been seen in 3 yrs. She needs to be seen first for annual before doing IUD procedures. We can discuss IUD info at that time. She has PCP [facility] but hasn't had a physical there in several yrs--seen for prob visits only.

## 2016-12-16 NOTE — Telephone Encounter (Signed)
Message to [facility] for Rx and to discuss Skyla vs Rutha BouchardKyleena

## 2016-12-20 NOTE — Telephone Encounter (Signed)
Pt is schedule for annual with Helmut Musterlicia Copland 01/17/17

## 2017-01-17 ENCOUNTER — Ambulatory Visit (INDEPENDENT_AMBULATORY_CARE_PROVIDER_SITE_OTHER): Payer: 59 | Admitting: Obstetrics and Gynecology

## 2017-01-17 ENCOUNTER — Telehealth: Payer: Self-pay | Admitting: Obstetrics and Gynecology

## 2017-01-17 ENCOUNTER — Encounter: Payer: Self-pay | Admitting: Obstetrics and Gynecology

## 2017-01-17 ENCOUNTER — Other Ambulatory Visit: Payer: Self-pay | Admitting: Obstetrics and Gynecology

## 2017-01-17 VITALS — BP 136/86 | HR 78 | Ht 65.0 in | Wt 292.0 lb

## 2017-01-17 DIAGNOSIS — Z113 Encounter for screening for infections with a predominantly sexual mode of transmission: Secondary | ICD-10-CM

## 2017-01-17 DIAGNOSIS — Z30431 Encounter for routine checking of intrauterine contraceptive device: Secondary | ICD-10-CM

## 2017-01-17 DIAGNOSIS — Z124 Encounter for screening for malignant neoplasm of cervix: Secondary | ICD-10-CM | POA: Diagnosis not present

## 2017-01-17 DIAGNOSIS — Z01419 Encounter for gynecological examination (general) (routine) without abnormal findings: Secondary | ICD-10-CM

## 2017-01-17 MED ORDER — MISOPROSTOL 100 MCG PO TABS: 100.0000 ug | ORAL_TABLET | Freq: Once | ORAL | 0 refills | Status: DC

## 2017-01-17 NOTE — Progress Notes (Signed)
CC: Annual exam IUD change    HPI:      [name] is a 21 y.o. G0P0000 who LMP was No LMP recorded (lmp unknown). Patient is not currently having periods (Reason: IUD)., presents today for her annual examination.  Her menses are absent due to IUD. Dysmenorrhea none. She does have occas intermenstrual bleeding. She started IUD due to menorrhagia and has had great sx relief.   Sex activity: single partner, contraception - IUD. Skyla 01/16/14. She would like another IUD.  Last Pap: none  There is no FH of breast cancer. There is no FH of ovarian cancer. The patient does do self-breast exams.  Tobacco use: The patient denies current or previous tobacco use. Alcohol use: none Exercise: moderately active  She does get adequate calcium and Vitamin D in her diet.    Past Medical History:  Diagnosis Date  . Childhood obesity   . Menorrhagia   . UTI (urinary tract infection)     Past Surgical History:  Procedure Laterality Date  . TONSILECTOMY, ADENOIDECTOMY, BILATERAL MYRINGOTOMY AND TUBES      Family History  Problem Relation Age of Onset  . Diabetes Maternal Grandfather     Social History   Social History  . Marital status: Single    Spouse name: N/A  . Number of children: N/A  . Years of education: N/A   Occupational History  . Not on file.   Social History Main Topics  . Smoking status: Never Smoker  . Smokeless tobacco: Never Used  . Alcohol use 0.0 oz/week     Comment: SOCIALLY  . Drug use: No  . Sexual activity: Yes    Birth control/ protection: IUD   Other Topics Concern  . Not on file   Social History Narrative   Lives with mom, carolyn Zobrist.   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     Current Outpatient Prescriptions:  .  levonorgestrel (MIRENA) 20 MCG/24HR IUD, 1 each by Intrauterine route once., Disp: , Rfl:  .  misoprostol (CYTOTEC) 100 MCG tablet, Take 1 tablet (100 mcg total) by mouth once. 1 hour before appt,  Disp: 1 tablet, Rfl: 0 .  phentermine 30 MG capsule, Take 1 capsule (30 mg total) by mouth every morning. (Patient not taking: Reported on 01/17/2017), Disp: 30 capsule, Rfl: 0  ROS:  Review of Systems  Constitutional: Negative for fever, malaise/fatigue and weight loss.  HENT: Negative for congestion, ear pain and sinus pain.   Respiratory: Negative for cough, shortness of breath and wheezing.   Cardiovascular: Negative for chest pain, orthopnea and leg swelling.  Gastrointestinal: Negative for constipation, diarrhea, nausea and vomiting.  Genitourinary: Negative for dysuria, frequency, hematuria and urgency.       Breast ROS: negative   Musculoskeletal: Negative for back pain, joint pain and myalgias.  Skin: Negative for itching and rash.  Neurological: Negative for dizziness, tingling, focal weakness and headaches.  Endo/Heme/Allergies: Negative for environmental allergies. Does not bruise/bleed easily.  Psychiatric/Behavioral: Negative for depression and suicidal ideas. The patient is not nervous/anxious and does not have insomnia.     Objective: BP 136/86 (Patient Position: Sitting)   Pulse 78   Ht  (1.651 m)   Wt 292 lb (132.5 kg)   LMP  (LMP Unknown)   BMI 48.59 kg/m    Physical Exam  Constitutional: She is oriented to person, place, and time. She appears well-developed and well-nourished.  Genitourinary: Vagina normal  and uterus normal. No erythema or tenderness in the vagina. No vaginal discharge found. Right adnexum does not display mass and does not display tenderness. Left adnexum does not display mass and does not display tenderness.  Cervix exhibits visible IUD strings. Cervix does not exhibit motion tenderness or polyp. Uterus is not enlarged or tender.  Neck: Normal range of motion. No thyromegaly present.  Cardiovascular: Normal rate, regular rhythm and normal heart sounds.   No murmur heard. Pulmonary/Chest: Effort normal and breath sounds normal. Right  breast exhibits no mass, no nipple discharge, no skin change and no tenderness. Left breast exhibits no mass, no nipple discharge, no skin change and no tenderness.  Abdominal: Soft. There is no tenderness. There is no guarding.  Musculoskeletal: Normal range of motion.  Neurological: She is alert and oriented to person, place, and time. No cranial nerve deficit.  Psychiatric: She has a normal mood and affect. Her behavior is normal.  Vitals reviewed.    Assessment/Plan: Encounter for annual routine gynecological examination  Cervical cancer screening - Plan: IGP,CtNgTv,rfx Aptima HPV ASCU  Screening for STD (sexually transmitted disease) - Plan: IGP,CtNgTv,rfx Aptima HPV ASCU  Encounter for routine checking of intrauterine contraceptive device (IUD) - Due for removal now. RTO for kyleena insertion. Rx cytotec/NSAIDs. Condoms now due to expiration of Skyla. - Plan: misoprostol (CYTOTEC) 100 MCG tablet             GYN counsel STD prevention, family planning choices, adequate intake of calcium and vitamin D, diet and exercise    F/U  Return in about 1 year (around 01/17/2018) for IUD rem/insertion when pt desires.  Alicia B. Copland, PA-C 01/17/2017 2:40 PM

## 2017-01-17 NOTE — Telephone Encounter (Signed)
Patient scheduled 4/25 for kyleena insertion with ABC 4:10.

## 2017-01-18 NOTE — Telephone Encounter (Signed)
Noted. Will order to arrive by apt date/time. 

## 2017-01-19 ENCOUNTER — Encounter: Payer: Self-pay | Admitting: Obstetrics and Gynecology

## 2017-01-19 ENCOUNTER — Ambulatory Visit (INDEPENDENT_AMBULATORY_CARE_PROVIDER_SITE_OTHER): Payer: 59 | Admitting: Obstetrics and Gynecology

## 2017-01-19 VITALS — BP 124/90 | HR 78 | Ht 65.0 in | Wt 291.0 lb

## 2017-01-19 DIAGNOSIS — Z30433 Encounter for removal and reinsertion of intrauterine contraceptive device: Secondary | ICD-10-CM | POA: Diagnosis not present

## 2017-01-19 MED ORDER — LEVONORGESTREL 19.5 MG IU IUD: 19.5000 mg | INTRAUTERINE_SYSTEM | Freq: Once | INTRAUTERINE | 0 refills | Status: DC

## 2017-01-19 NOTE — Telephone Encounter (Signed)
Kyleena stock reserved for this patient. 

## 2017-01-19 NOTE — Patient Instructions (Signed)

## 2017-01-19 NOTE — Progress Notes (Signed)
   Chief Complaint  Patient presents with  . Sheena Tapia iud removal  . kyleena iud insertion     History of Present Illness:  Sheena Tapia is a 21 y.o. that had a Skyla IUD placed approximately 3 yrs ago. Since that time, she states she has done really well and would like another IUD.  BP 124/90 (Patient Position: Sitting)   Pulse 78   Ht  (1.651 m)   Wt 291 lb (132 kg)   LMP  (LMP Unknown)   BMI 48.42 kg/m   Pelvic exam:  Two IUD strings present seen coming from the cervical os. EGBUS, vaginal vault and cervix: within normal limits  IUD Removal Strings of IUD identified and grasped.  IUD removed without problem with ring forceps.  Pt tolerated this well.  IUD noted to be intact.  Assessment:  IUD Removal   Plan: IUD removed and plan for contraception is IUD. She was amenable to this plan.     IUD INSERTION PROCEDURE NOTE:  Sheena Tapia is a 21 y.o. G0P0000 here for [facility]  IUD insertion. No GYN concerns.  Last pap smear was normal.  BP 124/90 (Patient Position: Sitting)   Pulse 78   Ht  (1.651 m)   Wt 291 lb (132 kg)   LMP  (LMP Unknown)   BMI 48.42 kg/m   IUD Insertion Procedure Note Patient identified, informed consent performed, consent signed.   Discussed risks of irregular bleeding, cramping, infection, malpositioning or misplacement of the IUD outside the uterus which may require further procedure such as laparoscopy, risk of failure <1%. Time out was performed.    A bimanual exam showed the uterus to be anteverted.  Speculum placed in the vagina.  Cervix visualized.  Cleaned with Betadine x 2.  Grasped anteriorly with a single tooth tenaculum.  Uterus sounded to 7.0 cm.   IUD placed per manufacturer's recommendations.  Strings trimmed to 3 cm. Tenaculum was removed, good hemostasis noted.  Patient tolerated procedure well.   ASSESSMENT: IUD insertion Encounter for removal and reinsertion of intrauterine contraceptive device (IUD) - Skyla  removed, Kyleena inserted. RTO in 4 wks for f/u. - Plan: Levonorgestrel (KYLEENA) 19.5 MG IUD    Plan:  Patient was given post-procedure instructions.  She was advised to have backup contraception for one week.   Call if you are having increasing pain, cramps or bleeding or if you have a fever greater than 100.4 degrees F., shaking chills, nausea or vomiting. Patient was also asked to check IUD strings periodically and follow up in 4 weeks for IUD check.  Return in about 4 weeks (around 02/16/2017) for IUD string check.  Alicia B. Copland, PA-C 01/19/2017 4:45 PM

## 2017-01-20 LAB — IGP,CTNGTV,RFX APTIMA HPV ASCU
Chlamydia, Nuc. Acid Amp: NEGATIVE
GONOCOCCUS, NUC. ACID AMP: NEGATIVE
PAP SMEAR COMMENT: 0
Trich vag by NAA: NEGATIVE

## 2017-02-14 ENCOUNTER — Ambulatory Visit (INDEPENDENT_AMBULATORY_CARE_PROVIDER_SITE_OTHER): Payer: 59 | Admitting: Obstetrics and Gynecology

## 2017-02-14 ENCOUNTER — Encounter: Payer: Self-pay | Admitting: Obstetrics and Gynecology

## 2017-02-14 VITALS — BP 116/7 | Ht 65.0 in | Wt 290.0 lb

## 2017-02-14 DIAGNOSIS — F419 Anxiety disorder, unspecified: Secondary | ICD-10-CM

## 2017-02-14 DIAGNOSIS — Z30431 Encounter for routine checking of intrauterine contraceptive device: Secondary | ICD-10-CM

## 2017-02-14 MED ORDER — ALPRAZOLAM 0.25 MG PO TABS: 0.2500 mg | ORAL_TABLET | Freq: Two times a day (BID) | ORAL | 0 refills | Status: DC | PRN

## 2017-02-14 NOTE — Progress Notes (Signed)
   Chief Complaint  Patient presents with  . Follow-up    IUD string check  anxiety   History of Present Illness:  Sheena Tapia is a 21 y.o. that had a [place] IUD placed approximately 1 month ago. Since that time, she deneis dyspareunia, pelvic pain, non-menstrual bleeding, vaginal d/c, heavy bleeding.   She also has had issues with anxiety for a long time but sx are worse recently. She is in nursing school and working full time. She tends to overthink issues and can't let go. She has trouble relaxing and worries. She denies SI, depression sx. There is a FH of anxiety in her mom. Pt not interested in daily meds.   Review of Systems  Constitutional: Negative for fever.  Gastrointestinal: Negative for blood in stool, constipation, diarrhea, nausea and vomiting.  Genitourinary: Negative for dyspareunia, dysuria, flank pain, frequency, hematuria, urgency, vaginal bleeding, vaginal discharge and vaginal pain.  Musculoskeletal: Negative for back pain.  Skin: Negative for rash.  Psychiatric/Behavioral: The patient is nervous/anxious.     Physical Exam:  BP (!) 116/7   Ht 5\' 5"  (1.651 m)   Wt 290 lb (131.5 kg)   LMP  (LMP Unknown)   BMI 48.26 kg/m  Body mass index is 48.26 kg/m.  Pelvic exam:  Two IUD strings present seen coming from the cervical os. EGBUS, vaginal vault and cervix: within normal limits  GAD-7=8 PHQ-9=4  Assessment:  Routine checking of IUD Encounter for routine checking of intrauterine contraceptive device (IUD)  Anxiety - Pt with mild-mod anxiety. Discussed seeing therapist/exercise/stress reduction/SSRIs/benzos. Pt doens't need meds regularly. Rx xanax. Take sparingly. F/u prn. - Plan: ALPRAZolam (XANAX) 0.25 MG tablet   IUD strings present in proper location; pt doing well  Plan: F/u if any signs of infection or can no longer feel the strings.   Alicia B. Copland, PA-C 02/14/2017 4:32 PM

## 2018-01-11 ENCOUNTER — Encounter: Payer: Self-pay | Admitting: Family Medicine

## 2018-01-11 ENCOUNTER — Ambulatory Visit (INDEPENDENT_AMBULATORY_CARE_PROVIDER_SITE_OTHER): Payer: 59 | Admitting: Family Medicine

## 2018-01-11 VITALS — BP 116/82 | HR 87 | Temp 98.1°F | Ht 65.0 in | Wt 267.0 lb

## 2018-01-11 DIAGNOSIS — Z0000 Encounter for general adult medical examination without abnormal findings: Secondary | ICD-10-CM | POA: Diagnosis not present

## 2018-01-11 DIAGNOSIS — Z020 Encounter for examination for admission to educational institution: Secondary | ICD-10-CM | POA: Diagnosis not present

## 2018-01-11 DIAGNOSIS — Z3009 Encounter for other general counseling and advice on contraception: Secondary | ICD-10-CM | POA: Insufficient documentation

## 2018-01-11 DIAGNOSIS — Z23 Encounter for immunization: Secondary | ICD-10-CM | POA: Diagnosis not present

## 2018-01-11 LAB — CBC WITH DIFFERENTIAL/PLATELET
Basophils Absolute: 0.1 10*3/uL (ref 0.0–0.1)
Basophils Relative: 0.6 % (ref 0.0–3.0)
EOS ABS: 0.1 10*3/uL (ref 0.0–0.7)
Eosinophils Relative: 1.3 % (ref 0.0–5.0)
HCT: 45.5 % (ref 36.0–46.0)
HEMOGLOBIN: 15.6 g/dL — AB (ref 12.0–15.0)
LYMPHS PCT: 26.1 % (ref 12.0–46.0)
Lymphs Abs: 2.3 10*3/uL (ref 0.7–4.0)
MCHC: 34.4 g/dL (ref 30.0–36.0)
MCV: 87.1 fl (ref 78.0–100.0)
MONO ABS: 0.4 10*3/uL (ref 0.1–1.0)
Monocytes Relative: 4.2 % (ref 3.0–12.0)
Neutro Abs: 5.9 10*3/uL (ref 1.4–7.7)
Neutrophils Relative %: 67.8 % (ref 43.0–77.0)
Platelets: 295 10*3/uL (ref 150.0–400.0)
RBC: 5.23 Mil/uL — AB (ref 3.87–5.11)
RDW: 13.1 % (ref 11.5–15.5)
WBC: 8.7 10*3/uL (ref 4.0–10.5)

## 2018-01-11 LAB — MICROALBUMIN / CREATININE URINE RATIO
CREATININE, U: 220.5 mg/dL
MICROALB UR: 1.5 mg/dL (ref 0.0–1.9)
MICROALB/CREAT RATIO: 0.7 mg/g (ref 0.0–30.0)

## 2018-01-11 NOTE — Progress Notes (Signed)
Subjective:   Patient ID: Sheena Tapia, female    DOB: 1995/10/25, 22 y.o.   MRN: [id number]  Sheena Tapia is a pleasant 22 y.o. year old female who presents to clinic today with Annual Exam (Patient is here today for a CPE without PAP.  She is not currently fasting.  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 Has IUD.  Starting nursing school soon.  Needs Tdap.  Current Outpatient Medications on File Prior to Visit  Medication Sig Dispense Refill  . ALPRAZolam (XANAX) 0.25 MG tablet Take 1 tablet (0.25 mg total) by mouth 2 (two) times daily as needed for anxiety. 20 tablet 0  . Levonorgestrel (KYLEENA) 19.5 MG IUD 19.5 mg by Intrauterine route once. 1 Intra Uterine Device 0   No current facility-administered medications on file prior to visit.     No Known Allergies  Past Medical History:  Diagnosis Date  . Childhood obesity   . Menorrhagia   . UTI (urinary tract infection)     Past Surgical History:  Procedure Laterality Date  . TONSILECTOMY, ADENOIDECTOMY, BILATERAL MYRINGOTOMY AND TUBES      Family History  Problem Relation Age of Onset  . Diabetes Maternal Grandfather     Social History   Socioeconomic History  . Marital status: Single    Spouse name: Not on file  . Number of children: Not on file  . Years of education: Not on file  . Highest education level: Not on file  Occupational History  . Not on file  Social Needs  . Financial resource strain: Not on file  . Food insecurity:    Worry: Not on file    Inability: Not on file  . Transportation needs:    Medical: Not on file    Non-medical: Not on file  Tobacco Use  . Smoking status: Never Smoker  . Smokeless tobacco: Never Used  Substance and Sexual Activity  . Alcohol use: Yes    Alcohol/week: 0.0 oz    Comment: SOCIALLY  . Drug use: No  . Sexual activity: Yes    Birth control/protection: IUD  Lifestyle  . Physical activity:     Days per week: Not on file    Minutes per session: Not on file  . Stress: Not on file  Relationships  . Social connections:    Talks on phone: Not on file    Gets together: Not on file    Attends religious service: Not on file    Active member of club or organization: Not on file    Attends meetings of clubs or organizations: Not on file    Relationship status: Not on file  . Intimate partner violence:    Fear of current or ex partner: Not on file    Emotionally abused: Not on file    Physically abused: Not on file    Forced sexual activity: Not on file  Other Topics Concern  . Not on file  Social History Narrative   Lives with mom, carolyn Harral.   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   The PMH, PSH, Social History, Family History, Medications, and allergies [facility], and have been updated if relevant.   Review of Systems  Constitutional: Negative.   HENT: Negative.   Eyes: Negative.   Respiratory: Negative.   Cardiovascular: Negative.   Gastrointestinal: Negative.  Endocrine: Negative.   Genitourinary: Negative.   Musculoskeletal: Negative.   Neurological: Negative.   Hematological: Negative.   Psychiatric/Behavioral: Negative.   All other systems reviewed and are negative.      Objective:    BP 116/82 (BP Location: Left Arm, Patient Position: Sitting, Cuff Size: Normal)   Pulse 87   Temp 98.1 F (36.7 C) (Oral)   Ht 5\' 5"  (1.651 m)   Wt 267 lb (121.1 kg)   SpO2 97%   BMI 44.43 kg/m    Physical Exam   General:  Well-developed,well-nourished,in no acute distress; alert,appropriate and cooperative throughout examination Head:  normocephalic and atraumatic.   Eyes:  vision grossly intact, PERRL Ears:  R ear normal and L ear normal externally, TMs clear bilaterally Nose:  no external deformity.   Mouth:  good dentition.   Neck:  No deformities, masses, or tenderness noted. Lungs:  Normal respiratory effort,  chest expands symmetrically. Lungs are clear to auscultation, no crackles or wheezes. Heart:  Normal rate and regular rhythm. S1 and S2 normal without gallop, murmur, click, rub or other extra sounds. Abdomen:  Bowel sounds positive,abdomen soft and non-tender without masses, organomegaly or hernias noted. Msk:  No deformity or scoliosis noted of thoracic or lumbar spine.   Extremities:  No clubbing, cyanosis, edema, or deformity noted with normal full range of motion of all joints.   Neurologic:  alert & oriented X3 and gait normal.   Skin:  Intact without suspicious lesions or rashes Psych:  Cognition and judgment appear intact. Alert and cooperative with normal attention span and concentration. No apparent delusions, illusions, hallucinations       Assessment & Plan:   Need for Tdap vaccination - Plan: Tdap vaccine greater than or equal to 7yo IM No follow-ups on file.

## 2018-01-11 NOTE — Assessment & Plan Note (Signed)
Tdap given to pt. Immunizations updated in [facility] and copy given to pt. Hearing and vision completed, labs drawn as per what is listed as required on her form. Form completed and return to pt.

## 2018-01-23 ENCOUNTER — Ambulatory Visit (INDEPENDENT_AMBULATORY_CARE_PROVIDER_SITE_OTHER): Payer: 59 | Admitting: Family Medicine

## 2018-01-23 ENCOUNTER — Encounter: Payer: Self-pay | Admitting: Family Medicine

## 2018-01-23 VITALS — BP 112/60 | HR 73 | Temp 98.4°F | Ht 65.0 in | Wt 262.2 lb

## 2018-01-23 DIAGNOSIS — F419 Anxiety disorder, unspecified: Secondary | ICD-10-CM | POA: Insufficient documentation

## 2018-01-23 MED ORDER — SERTRALINE HCL 50 MG PO TABS: 50.0000 mg | ORAL_TABLET | Freq: Every day | ORAL | 3 refills | Status: DC

## 2018-01-23 NOTE — Progress Notes (Signed)
Subjective:   Patient ID: Sheena Tapia, female    DOB: 1996/07/01, 22 y.o.   MRN: [id number]  Sheena Tapia is a pleasant 22 y.o. year old female who presents to clinic today with Anxiety (Patient is here today C/O anxiety.  She has tried to do it on her own but realizes she needs help now.)  on 01/23/2018  HPI:  Anxiety- has been progressive over the past year. Feels anxious about school, about doing well.  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  Denies feeling depressed.  No SI or HI.  Took xanax for short period of time but has never been on any other anxiolytics or antidepressants.   Current Outpatient Medications on File Prior to Visit  Medication Sig Dispense Refill  . ALPRAZolam (XANAX) 0.25 MG tablet Take 1 tablet (0.25 mg total) by mouth 2 (two) times daily as needed for anxiety. (Patient not taking: Reported on 01/23/2018) 20 tablet 0  . Levonorgestrel (KYLEENA) 19.5 MG IUD 19.5 mg by Intrauterine route once. 1 Intra Uterine Device 0   No current facility-administered medications on file prior to visit.     No Known Allergies  Past Medical History:  Diagnosis Date  . Childhood obesity   . Menorrhagia   . UTI (urinary tract infection)     Past Surgical History:  Procedure Laterality Date  . TONSILECTOMY, ADENOIDECTOMY, BILATERAL MYRINGOTOMY AND TUBES      Family History  Problem Relation Age of Onset  . Diabetes Maternal Grandfather     Social History   Socioeconomic History  . Marital status: Single    Spouse name: Not on file  . Number of children: Not on file  . Years of education: Not on file  . Highest education level: Not on file  Occupational History  . Not on file  Social Needs  . Financial resource strain: Not on file  . Food insecurity:    Worry: Not on file    Inability: Not on file  . Transportation needs:    Medical: Not on file    Non-medical: Not on file  Tobacco Use  . Smoking status: Never Smoker  . Smokeless tobacco: Never Used    Substance and Sexual Activity  . Alcohol use: Yes    Alcohol/week: 0.0 oz    Comment: SOCIALLY  . Drug use: No  . Sexual activity: Yes    Birth control/protection: IUD  Lifestyle  . Physical activity:    Days per week: Not on file    Minutes per session: Not on file  . Stress: Not on file  Relationships  . Social connections:    Talks on phone: Not on file    Gets together: Not on file    Attends religious service: Not on file    Active member of club or organization: Not on file    Attends meetings of clubs or organizations: Not on file    Relationship status: Not on file  . Intimate partner violence:    Fear of current or ex partner: Not on file    Emotionally abused: Not on file    Physically abused: Not on file    Forced sexual activity: Not on file  Other Topics Concern  . Not on file  Social History Narrative   Lives with mom, carolyn Bruni.   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   The PMH, PSH, Social History, Family History, Medications, and allergies have been reviewed  in [facility], and have been updated if relevant.   Review of Systems  Psychiatric/Behavioral: Positive for decreased concentration and sleep disturbance. Negative for agitation, behavioral problems, confusion, dysphoric mood, hallucinations, self-injury and suicidal ideas. The patient is nervous/anxious. The patient is not hyperactive.   All other systems reviewed and are negative.      Objective:    BP 112/60 (BP Location: Left Arm, Patient Position: Sitting, Cuff Size: Normal)   Pulse 73   Temp 98.4 F (36.9 C) (Oral)   Ht  (1.651 m)   Wt 262 lb 3.2 oz (118.9 kg)   SpO2 99%   BMI 43.63 kg/m    Physical Exam   General:  Well-developed,well-nourished,in no acute distress; alert,appropriate and cooperative throughout examination Head:  normocephalic and atraumatic.   Eyes:  vision grossly intact, PERRL Ears:  R ear normal and L ear normal externally, TMs clear  bilaterally Nose:  no external deformity.   Mouth:  good dentition.   Neck:  No deformities, masses, or tenderness noted. Lungs:  Normal respiratory effort, chest expands symmetrically. Lungs are clear to auscultation, no crackles or wheezes. Heart:  Normal rate and regular rhythm. S1 and S2 normal without gallop, murmur, click, rub or other extra sounds. Msk:  No deformity or scoliosis noted of thoracic or lumbar spine.   Extremities:  No clubbing, cyanosis, edema, or deformity noted with normal full range of motion of all joints.   Neurologic:  alert & oriented X3 and gait normal.   Skin:  Intact without suspicious lesions or rashes Psych:  Cognition and judgment appear intact. Alert and cooperative with normal attention span and concentration. No apparent delusions, illusions, hallucinations       Assessment & Plan:   Anxiety No follow-ups on file.

## 2018-01-23 NOTE — Assessment & Plan Note (Signed)
>  25 minutes spent in face to face time with patient, >50% spent in counselling or coordination of care Start Zoloft 50 mg. Patient is to take 1/2 tablet daily for 10 days, then advance to 1 full tablet thereafter. We discussed possible side effects of headache, GI upset, drowsiness, and SI/HI. If thoughts of SI/HI develop, we discussed to present to the emergency immediately. Patient verbalized understanding.   She declined psychotherapy at this time.  She will follow up with me in 3-4 weeks.

## 2018-01-23 NOTE — Patient Instructions (Signed)
Great to see you.  Start Zoloft 50 mg. We are starting zoloft- please take 1/2 tablet daily for 10 days, then advance to 1 full tablet thereafter.  Please keep me updated in a few weeks.

## 2018-05-09 ENCOUNTER — Telehealth: Payer: Self-pay | Admitting: Family Medicine

## 2018-05-09 MED ORDER — CITALOPRAM HYDROBROMIDE 20 MG PO TABS: 20.0000 mg | ORAL_TABLET | Freq: Every day | ORAL | 3 refills | Status: DC

## 2018-05-09 NOTE — Telephone Encounter (Signed)
TA-Patient states that she had tried the Sertraline and was on it for 1 month then stopped it because she wasn't sure if there was any difference because she felt the same as she did prior to starting it/she is requesting to possibly go up in dosage or change to something else/is aware that Dr. Dayton MartesAron is out of the office and is willing to wait till she returns/plz advise/thx dmf

## 2018-05-09 NOTE — Telephone Encounter (Addendum)
Copied from CRM [phone]. Topic: General - Other >> May 09, 2018  1:53 PM Leafy Roobinson, Norma J wrote: Reason for CRM: pt is calling and would like to discuss generic zoloft. Pt did not want to make an appt due to she does not have her calendar. Pt does not like the way generic zoloft is making her feel. Pt is requesting the md or nurse to return her call

## 2018-05-09 NOTE — Telephone Encounter (Signed)
So she hasn't been taking it for awhile correct?  Let's try something else than rather than increasing the dose since we would have to start at her original dose and taper up from that.  Let's try celexa.  eRx sent to pharmacy on file.  Okay to cut celexa tablet in half (take 10 mg instead of the full 20 mg tablet) for the first few days to help with side effects.  Please keep us updated.

## 2018-05-10 NOTE — Telephone Encounter (Signed)
Thank you/thx dmf 

## 2018-05-10 NOTE — Telephone Encounter (Signed)
Pt called and is heading to pharmacy to pick up celexa she stated she will keep Dr Dayton MartesAron updated

## 2018-05-10 NOTE — Telephone Encounter (Signed)
Tried to call pt twice and it says "enter remote mailbox number" so I am unable to leave a message/will try again later/thx dmf

## 2018-07-13 DIAGNOSIS — Z23 Encounter for immunization: Secondary | ICD-10-CM | POA: Diagnosis not present

## 2019-06-28 ENCOUNTER — Other Ambulatory Visit: Payer: Self-pay

## 2019-06-28 ENCOUNTER — Other Ambulatory Visit: Payer: Self-pay | Admitting: Family Medicine

## 2019-06-28 ENCOUNTER — Ambulatory Visit (INDEPENDENT_AMBULATORY_CARE_PROVIDER_SITE_OTHER): Payer: Self-pay | Admitting: Family Medicine

## 2019-06-28 ENCOUNTER — Encounter: Payer: Self-pay | Admitting: Family Medicine

## 2019-06-28 VITALS — Ht 65.0 in | Wt 250.3 lb

## 2019-06-28 DIAGNOSIS — R1013 Epigastric pain: Secondary | ICD-10-CM

## 2019-06-28 MED ORDER — PANTOPRAZOLE SODIUM 40 MG PO TBEC: 40.0000 mg | DELAYED_RELEASE_TABLET | Freq: Every day | ORAL | 3 refills | Status: DC

## 2019-06-28 NOTE — Telephone Encounter (Signed)
Pt is requesting rx for pantoprazole (PROTONIX) 40 MG tablet to be sent to [facility] instead of [facility] due to it being $70 cheaper at [facility]. Please advise.  [place], [place] [address] [phone] (Phone) [phone] (Fax)

## 2019-06-28 NOTE — Progress Notes (Signed)
Virtual Visit via Video   Due to the COVID-19 pandemic, this visit was completed with telemedicine (audio/video) technology to reduce patient and provider exposure as well as to preserve personal protective equipment.   I connected with Sheena Tapia by a video enabled telemedicine application and verified that I am speaking with the correct person using two identifiers. Location patient: Home Location provider: [facility], Office Persons participating in the virtual visit: Tamsen Snider, MD   I discussed the limitations of evaluation and management by telemedicine and the availability of in person appointments. The patient expressed understanding and agreed to proceed.  Care Team   Patient Care Team: Dianne Dun, MD as PCP - General (Family Medicine)  Subjective:   HPI:   Epigastric pain x 1 week- gets worse when she eats.  Oatmeal and yogurt she can tolerate.  Has been taking Nexium and Tums with mild improvement.  No black, blood stool, nausea, or vomiting.   She reports that she thinks this is an ulcer and she needs an antibiotic.  No black or blood stools.  Is not worsened when she presses on her epigastric area or RUQ.  She is asking for antibiotic to help.   Review of Systems  Constitutional: Negative.   HENT: Negative.   Eyes: Negative.   Respiratory: Negative.   Cardiovascular: Negative.   Gastrointestinal: Positive for abdominal pain and heartburn. Negative for blood in stool, constipation, diarrhea, melena, nausea and vomiting.  Genitourinary: Negative.   Musculoskeletal: Negative.   Skin: Negative.   Neurological: Negative.   Psychiatric/Behavioral: Negative.   All other systems reviewed and are negative.    Patient Active Problem List   Diagnosis Date Noted  . Anxiety 01/23/2018  . Fatigue 12/24/2014  . Acne 08/05/2014  . Severe obesity (BMI >= 40) (HCC) 02/01/2011    Social History   Tobacco Use  . Smoking status: Never  Smoker  . Smokeless tobacco: Never Used  Substance Use Topics  . Alcohol use: Yes    Alcohol/week: 0.0 standard drinks    Comment: SOCIALLY    Current Outpatient Medications:  .  ALPRAZolam (XANAX) 0.25 MG tablet, Take 1 tablet (0.25 mg total) by mouth 2 (two) times daily as needed for anxiety. (Patient not taking: Reported on 01/23/2018), Disp: 20 tablet, Rfl: 0 .  citalopram (CELEXA) 20 MG tablet, Take 1 tablet (20 mg total) by mouth daily. (Patient not taking: Reported on 06/28/2019), Disp: 30 tablet, Rfl: 3 .  Levonorgestrel (KYLEENA) 19.5 MG IUD, 19.5 mg by Intrauterine route once., Disp: 1 Intra Uterine Device, Rfl: 0  No Known Allergies  Objective:  Ht 5\' 5"  (1.651 m)   Wt 250 lb 4.8 oz (113.5 kg)   BMI 41.65 kg/m   VITALS: Per patient if applicable, see vitals. GENERAL: Alert, appears well and in no acute distress. HEENT: Atraumatic, conjunctiva clear, no obvious abnormalities on inspection of external nose and ears. NECK: Normal movements of the head and neck. CARDIOPULMONARY: No increased WOB. Speaking in clear sentences. I:E ratio WNL.  MS: Moves all visible extremities without noticeable abnormality. Abd:  Pain is not elicited when she presses on her abdomen- RUQ,epigatric or else where. PSYCH: Pleasant and cooperative, well-groomed. Speech normal rate and rhythm. Affect is appropriate. Insight and judgement are appropriate. Attention is focused, linear, and appropriate.  NEURO: CN grossly intact. Oriented as arrived to appointment on time with no prompting. Moves both UE equally.  SKIN: No obvious lesions, wounds, erythema, or  cyanosis noted on face or hands.  Depression screen [facility] 2/9 01/23/2018 01/11/2018  Decreased Interest 0 0  Down, Depressed, Hopeless 0 0  PHQ - 2 Score 0 0  Altered sleeping 1 -  Tired, decreased energy 2 -  Change in appetite 0 -  Feeling bad or failure about yourself  1 -  Trouble concentrating 2 -  Moving slowly or fidgety/restless 0 -   Suicidal thoughts 0 -  PHQ-9 Score 6 -  Difficult doing work/chores Somewhat difficult -    Assessment and Plan:   There are no diagnoses linked to this encounter.  Marland Kitchen COVID-19 Education: The signs and symptoms of COVID-19 were discussed with the patient and how to seek care for testing if needed. The importance of social distancing was discussed today. . Reviewed expectations re: course of current medical issues. . Discussed self-management of symptoms. . Outlined signs and symptoms indicating need for more acute intervention. . Patient verbalized understanding and all questions were answered. Marland Kitchen Health Maintenance issues including appropriate healthy diet, exercise, and smoking avoidance were discussed with patient. . See orders for this visit as documented in the electronic medical record.  Arnette Norris, MD  Records requested if needed. Time spent: 25 minutes, of which >50% was spent in obtaining information about her symptoms, reviewing her previous labs, evaluations, and treatments, counseling her about her condition (please see the discussed topics above), and developing a plan to further investigate it; she had a number of questions which I addressed.

## 2019-06-28 NOTE — Assessment & Plan Note (Addendum)
>  25 minutes spent in face to face time with patient, >50% spent in counselling or coordination of care Explained to her that we don't typically treat ulcers with abx unless it is an infection causing it like H pylori. Given pain is worsened by food- ? Biliary colic but she appears to have no tenderness, nausea, vomiting or fever. Discussed GERD friendly diet- discussed red flag symptoms- black stools, vomiting, etc that would warrant immediate evaluation. Advised to d/c nexium, protonix eRx sent to pharmacy on file- to be taken for no more than 2 weeks, especially if no improvement of symptoms. Call or send my chart message prn if these symptoms worsen or fail to improve as anticipated.   The patient indicates understanding of these issues and agrees with the plan.

## 2019-07-12 ENCOUNTER — Ambulatory Visit: Payer: Self-pay | Admitting: Family Medicine

## 2019-07-12 ENCOUNTER — Encounter: Payer: Self-pay | Admitting: Family Medicine

## 2019-07-12 ENCOUNTER — Other Ambulatory Visit: Payer: Self-pay

## 2019-07-12 VITALS — BP 120/80 | HR 101 | Temp 98.2°F | Ht 65.0 in | Wt 252.4 lb

## 2019-07-12 DIAGNOSIS — R1013 Epigastric pain: Secondary | ICD-10-CM

## 2019-07-12 MED ORDER — FAMOTIDINE 20 MG PO TABS: 20.0000 mg | ORAL_TABLET | Freq: Two times a day (BID) | ORAL | 1 refills | Status: DC

## 2019-07-12 NOTE — Progress Notes (Signed)
Sheena Tapia is a 23 y.o. female  Chief Complaint  Patient presents with  . GI Problem    Pt been having stomach pain on and off for about 3wks, she think its ulcers.    HPI: Sheena Tapia is a 23 y.o. female patient of Dr. Deborra Medina who complains of intermittent abdominal pain x 3 weeks. She was seen virtually by PCP on 06/28/19 who Rx'd protonix for pt to take x [redacted] wks along with dietary modification. Pt had previously been taking Tums and nexium for about 1 week prior to seeing PCP. Protonix was effective for a few days but pt feels it is not working.  Pain is epigastric in location, feels like a "burning" that she can feel "through to [my] back". Pain almost always after eating, but also occur first thing in AM.  Pt states she has had 2-3 days without any symptoms where she is able to eat anything.  Pt does drink soda, coffee, OJ, eats citrus foods and fruits, lots of fried and fast foods. She has changed to a more "bland diet" in the past 2 weeks - yogurt, milk, oatmeal, banana - and this has helped. She has no symptoms with these foods.  No n/d/c. Pt states she has made herself throw up a few times to help relieve symptoms. No blood in stool, no black or tarry stools.  No fever, chills. Pt is worried about a "untreated stomach ulcer" Pt denies frequent NSAID use. 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 Childhood obesity   . Menorrhagia   . UTI (urinary tract infection)     Past Surgical History:  Procedure Laterality Date  . TONSILECTOMY, ADENOIDECTOMY, BILATERAL MYRINGOTOMY AND TUBES      Social History   Socioeconomic History  . Marital status: Single    Spouse name: Not on file  .  Number of children: Not on file  . Years of education: Not on file  . Highest education level: Not on file  Occupational History  . Not on file  Social Needs  . Financial resource strain: Not on file  . Food insecurity    Worry: Not on file    Inability: Not on file  . Transportation needs    Medical: Not on file    Non-medical: Not on file  Tobacco Use  . Smoking status: Never Smoker  . Smokeless tobacco: Never Used  Substance and Sexual Activity  . Alcohol use: Yes    Alcohol/week: 0.0 standard drinks    Comment: SOCIALLY  . Drug use: No  . Sexual activity: Yes    Birth control/protection: I.U.D.  Lifestyle  . Physical activity    Days per week: Not on file    Minutes per session: Not on file  . Stress: Not on file  Relationships  . Social Herbalist on phone: Not on file    Gets together: Not on file    Attends religious service: Not on file    Active member of club or organization: Not on file    Attends meetings of clubs or organizations: Not on  file    Relationship status: Not on file  . Intimate partner violence    Fear of current or ex partner: Not on file    Emotionally abused: Not on file    Physically abused: Not on file    Forced sexual activity: Not on file  Other Topics Concern  . Not on file  Social History Narrative   Lives with mom, carolyn Kugelman.   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    Family History  Problem Relation Age of Onset  . Diabetes Maternal Grandfather      Immunization History  Administered Date(s) Administered  . DTaP 09/07/1994, 11/23/1994, 02/24/1995, 09/02/1995, 10/06/1998  . Hepatitis B 25-Feb-1996, 09/07/1994, 02/24/1995  . HiB (PRP-OMP) 09/07/1994, 11/23/1994, 02/24/1995, 09/02/1995  . IPV 09/07/1994, 11/23/1994, 02/24/1995, 10/06/1998  . Influenza-Unspecified 11/22/2011, 07/13/2018  . MMR 09/02/1995, 10/06/1998  . Tdap 10/28/2006, 01/11/2018  . Varicella 09/02/1995, 10/28/2006     Outpatient Encounter Medications as of 07/12/2019  Medication Sig  . pantoprazole (PROTONIX) 40 MG tablet Take 1 tablet (40 mg total) by mouth daily.  Marland Kitchen ALPRAZolam (XANAX) 0.25 MG tablet Take 1 tablet (0.25 mg total) by mouth 2 (two) times daily as needed for anxiety. (Patient not taking: Reported on 01/23/2018)  . citalopram (CELEXA) 20 MG tablet Take 1 tablet (20 mg total) by mouth daily. (Patient not taking: Reported on 06/28/2019)  . Levonorgestrel (KYLEENA) 19.5 MG IUD 19.5 mg by Intrauterine route once.   No facility-administered encounter medications on file as of 07/12/2019.      ROS: Pertinent positives and negatives noted in HPI. Remainder of ROS non-contributory  No Known Allergies  BP 120/80   Pulse (!) 101   Temp 98.2 F (36.8 C) (Oral)   Ht '5\' 5"'  (1.651 m)   Wt 252 lb 6.4 oz (114.5 kg)   SpO2 98%   BMI 42.00 kg/m   Physical Exam  Constitutional: She is oriented to person, place, and time. She appears well-developed and well-nourished.  Abdominal: Soft. Bowel sounds are normal. She exhibits no distension and no mass. There is no abdominal tenderness. There is no rebound and no guarding.  Neurological: She is alert and oriented to person, place, and time.  Psychiatric: She has a normal mood and affect. Her behavior is normal.     A/P:  1. Epigastric pain - likely GERD but could be d/t H pylori so will r/o with serum antibody test (pt has never been tested/treated for h pylori). Could also be d/t stress gastritis, ulcer, biliary colic - stop protonix - pt states it has not been effective - H. pylori antibody, IgG - diet modification and avoidance of trigger foods, limit portion sizes, avoid eating 2hrs prior to bedtime - info included in AVS Rx: - famotidine (PEPCID) 20 MG tablet; Take 1 tablet (20 mg total) by mouth 2 (two) times daily.  Dispense: 60 tablet; Refill: 1 - will contact pt with result of h pylori test when available Discussed plan and reviewed  medications with patient, including risks, benefits, and potential side effects. Pt expressed understand. All questions answered.

## 2019-07-12 NOTE — Patient Instructions (Signed)

## 2019-07-13 LAB — H. PYLORI ANTIBODY, IGG: H Pylori IgG: NEGATIVE

## 2019-08-04 ENCOUNTER — Other Ambulatory Visit: Payer: Self-pay | Admitting: Family Medicine

## 2019-08-04 DIAGNOSIS — R1013 Epigastric pain: Secondary | ICD-10-CM

## 2019-08-06 NOTE — Telephone Encounter (Signed)
Last fill 07/12/19  #60/1 Last OV 07/12/19

## 2019-08-20 ENCOUNTER — Other Ambulatory Visit: Payer: Self-pay | Admitting: Family Medicine

## 2019-08-20 DIAGNOSIS — R1013 Epigastric pain: Secondary | ICD-10-CM

## 2021-12-24 ENCOUNTER — Encounter: Payer: Self-pay | Admitting: Nurse Practitioner

## 2021-12-24 ENCOUNTER — Ambulatory Visit (INDEPENDENT_AMBULATORY_CARE_PROVIDER_SITE_OTHER): Payer: Self-pay | Admitting: Nurse Practitioner

## 2021-12-24 VITALS — BP 130/84 | HR 109 | Temp 97.6°F | Ht 65.0 in | Wt 186.4 lb

## 2021-12-24 DIAGNOSIS — F419 Anxiety disorder, unspecified: Secondary | ICD-10-CM

## 2021-12-24 DIAGNOSIS — Z3009 Encounter for other general counseling and advice on contraception: Secondary | ICD-10-CM

## 2021-12-24 MED ORDER — NORELGESTROMIN-ETH ESTRADIOL 150-35 MCG/24HR TD PTWK: 1.0000 | MEDICATED_PATCH | TRANSDERMAL | 12 refills | Status: DC

## 2021-12-24 NOTE — Patient Instructions (Addendum)
It was great to see you! ? ?We will call you to remove the IUD.  Start the birth control patch and change it once a week for 3 weeks and then do not wear a patch for 1 week after the IUD is removed.  Let me know if you have any side effects from it.  I have attached a coupon for you.  You can also see if he can follow-up at the health department or [facility] to get it cheaper ? ?Let's follow-up with any concerns. ? ?Take care, ? ?Rodman Pickle, NP ? ?

## 2021-12-24 NOTE — Progress Notes (Signed)
? ?Established Patient Office Visit ? ?Subjective:  ?Patient ID: Sheena Tapia, female    DOB: June 16, 1996  Age: 26 y.o. MRN: [id number] ? ?CC:  ?Chief Complaint  ?Patient presents with  ? Transitions Of Care  ?  Est care. Np. Update birth control.  ? ? ?HPI ?Sheena Tapia presents to transfer care to a new provider.  Introduced to Publishing rights manager role and practice setting.  All questions answered.  Discussed provider/patient relationship and expectations. ? ?She has the [facility] IUD that is due to be changed next month. She would like to talk about other birth control options.  She has had Nexplanon in the past and stated that it broke in half and she had a bad experience with that. She has lost 135 pounds over the past 2 years with diet and exercise.  She would like to consider birth control option that does not gain too much weight. ? ?She has a history of anxiety that is controlled without medication.  She states this is not bothering her at all right now.  She denies SI/HI. ? ? ?  12/25/2021  ?  7:50 AM 01/23/2018  ?  8:11 AM 01/11/2018  ? 11:30 AM  ?Depression screen PHQ 2/9  ?Decreased Interest 0 0 0  ?Down, Depressed, Hopeless 0 0 0  ?PHQ - 2 Score 0 0 0  ?Altered sleeping 0 1   ?Tired, decreased energy 0 2   ?Change in appetite 0 0   ?Feeling bad or failure about yourself  0 1   ?Trouble concentrating 0 2   ?Moving slowly or fidgety/restless 0 0   ?Suicidal thoughts 0 0   ?PHQ-9 Score 0 6   ?Difficult doing work/chores Not difficult at all Somewhat difficult   ? ? ?  12/25/2021  ?  7:50 AM 01/23/2018  ?  8:11 AM  ?GAD 7 : Generalized Anxiety Score  ?Nervous, Anxious, on Edge 0 2  ?Control/stop worrying 0 2  ?Worry too much - different things 0 2  ?Trouble relaxing 0 2  ?Restless 0 0  ?Easily annoyed or irritable 0 1  ?Afraid - awful might happen 0 0  ?Total GAD 7 Score 0 9  ?Anxiety Difficulty Not difficult at all Very difficult  ? ? ?Past Medical History:  ?Diagnosis Date  ? Childhood obesity   ? Menorrhagia   ?  UTI (urinary tract infection)   ? ? ?Past Surgical History:  ?Procedure Laterality Date  ? TONSILECTOMY, ADENOIDECTOMY, BILATERAL MYRINGOTOMY AND TUBES    ? ? ?Family History  ?Problem Relation Age of Onset  ? Diabetes Maternal Grandfather   ? ? ?Social History  ? ?Socioeconomic History  ? Marital status: Single  ?  Spouse name: Not on file  ? Number of children: Not on file  ? Years of education: Not on file  ? Highest education level: Not on file  ?Occupational History  ? Not on file  ?Tobacco Use  ? Smoking status: Never  ? Smokeless tobacco: Never  ?Vaping Use  ? Vaping Use: Never used  ?Substance and Sexual Activity  ? Alcohol use: Yes  ?  Alcohol/week: 0.0 standard drinks  ?  Comment: SOCIALLY  ? Drug use: No  ? Sexual activity: Yes  ?  Birth control/protection: I.U.D.  ?Other Topics Concern  ? Not on file  ?Social History Narrative  ? Not on file  ? ?Social Determinants of Health  ? ?Financial Resource Strain: Not on file  ?Food Insecurity: Not  on file  ?Transportation Needs: Not on file  ?Physical Activity: Not on file  ?Stress: Not on file  ?Social Connections: Not on file  ?Intimate Partner Violence: Not on file  ? ? ?Outpatient Medications Prior to Visit  ?Medication Sig Dispense Refill  ? ALPRAZolam (XANAX) 0.25 MG tablet Take 1 tablet (0.25 mg total) by mouth 2 (two) times daily as needed for anxiety. (Patient not taking: Reported on 01/23/2018) 20 tablet 0  ? citalopram (CELEXA) 20 MG tablet Take 1 tablet (20 mg total) by mouth daily. (Patient not taking: Reported on 06/28/2019) 30 tablet 3  ? famotidine (PEPCID) 20 MG tablet TAKE 1 TABLET BY MOUTH TWICE A DAY 180 tablet 1  ? Levonorgestrel (KYLEENA) 19.5 MG IUD 19.5 mg by Intrauterine route once. 1 Intra Uterine Device 0  ? pantoprazole (PROTONIX) 40 MG tablet Take 1 tablet (40 mg total) by mouth daily. 30 tablet 3  ? ?No facility-administered medications prior to visit.  ? ? ?No Known Allergies ? ?ROS ?Review of Systems  ?Constitutional:  Positive for  fatigue.  ?HENT: Negative.    ?Respiratory: Negative.    ?Cardiovascular: Negative.   ?Gastrointestinal: Negative.   ?Genitourinary: Negative.   ?Musculoskeletal: Negative.   ?Skin: Negative.   ?Neurological: Negative.   ?Psychiatric/Behavioral:  The patient is nervous/anxious (stable).   ? ?  ?Objective:  ?  ?Physical Exam ?Vitals and nursing note reviewed. Exam conducted with a chaperone present.  ?Constitutional:   ?   General: She is not in acute distress. ?   Appearance: Normal appearance.  ?HENT:  ?   Head: Normocephalic and atraumatic.  ?Eyes:  ?   Conjunctiva/sclera: Conjunctivae normal.  ?Cardiovascular:  ?   Rate and Rhythm: Normal rate.  ?Pulmonary:  ?   Effort: Pulmonary effort is normal.  ?Genitourinary: ?   General: Normal vulva.  ?   Exam position: Lithotomy position.  ?   Vagina: Normal.  ?   Cervix: Normal.  ?   Uterus: Normal.   ?   Comments: Strings from IUD visible ?Musculoskeletal:  ?   Cervical back: Normal range of motion.  ?Skin: ?   General: Skin is warm and dry.  ?Neurological:  ?   General: No focal deficit present.  ?   Mental Status: She is alert and oriented to person, place, and time.  ?Psychiatric:     ?   Mood and Affect: Mood normal.     ?   Behavior: Behavior normal.     ?   Thought Content: Thought content normal.     ?   Judgment: Judgment normal.  ? ? ?BP 130/84   Pulse (!) 109   Temp 97.6 ?F (36.4 ?C) (Temporal)   Ht 5\' 5"  (1.651 m)   Wt 186 lb 6.4 oz (84.6 kg)   SpO2 97%   BMI 31.02 kg/m?  ?Wt Readings from Last 3 Encounters:  ?12/24/21 186 lb 6.4 oz (84.6 kg)  ?07/12/19 252 lb 6.4 oz (114.5 kg)  ?06/28/19 250 lb 4.8 oz (113.5 kg)  ? ? ? ?Health Maintenance Due  ?Topic Date Due  ? COVID-19 Vaccine (1) Never done  ? HIV Screening  Never done  ? Hepatitis C Screening  Never done  ? PAP-Cervical Cytology Screening  Never done  ? PAP SMEAR-Modifier  01/18/2020  ? ? ?There are no preventive care reminders to display for this patient. ? ?Lab Results  ?Component Value Date  ?  TSH 1.99 12/24/2014  ? ?Lab Results  ?  Component Value Date  ? WBC 8.7 01/11/2018  ? HGB 15.6 (H) 01/11/2018  ? HCT 45.5 01/11/2018  ? MCV 87.1 01/11/2018  ? PLT 295.0 01/11/2018  ? ?Lab Results  ?Component Value Date  ? NA 137 12/24/2014  ? K 4.2 12/24/2014  ? CO2 28 12/24/2014  ? GLUCOSE 69 (L) 12/24/2014  ? BUN 11 12/24/2014  ? CREATININE 0.90 12/24/2014  ? BILITOT 0.5 12/24/2014  ? ALKPHOS 56 12/24/2014  ? AST 17 12/24/2014  ? ALT 15 12/24/2014  ? PROT 7.5 12/24/2014  ? ALBUMIN 4.3 12/24/2014  ? CALCIUM 9.6 12/24/2014  ? ANIONGAP 5 (L) 06/09/2014  ? GFR 84.45 12/24/2014  ? ?No results found for: CHOL ?No results found for: HDL ?No results found for: LDLCALC ?No results found for: TRIG ?No results found for: CHOLHDL ?Lab Results  ?Component Value Date  ? HGBA1C 4.9 12/24/2014  ? ? ?  ?Assessment & Plan:  ? ?Problem List Items Addressed This Visit   ? ?  ? Other  ? Birth control counseling  ?  She currently has an IUD that is set to expire in April/2023.  After discussing various birth control options, she is interested in starting the birth control patch.  Prescription was sent to the pharmacy.  Plan is to start this after IUD removal.  Attempted to remove IUD in office today, however did not have the forceps needed to reach the IUD string.  We will have her come back just for IUD removal.  Educated her not to start the birth control patch until after her IUD is removed.  We will call her to schedule when supplies come in. ?  ?  ? Anxiety - Primary  ?  Chronic, stable.  She is not on any medications and her symptoms are very well controlled.  Follow-up with any concerns. ?  ?  ? ? ?Meds ordered this encounter  ?Medications  ? norelgestromin-ethinyl estradiol [facility]) 150-35 MCG/24HR transdermal patch  ?  Sig: Place 1 patch onto the skin once a week.  ?  Dispense:  3 patch  ?  Refill:  12  ? ? ?Follow-up: Return if symptoms worsen or fail to improve.  ? ? ?Gerre Scull, NP ?

## 2021-12-25 NOTE — Assessment & Plan Note (Signed)
She currently has an IUD that is set to expire in April/2023.  After discussing various birth control options, she is interested in starting the birth control patch.  Prescription was sent to the pharmacy.  Plan is to start this after IUD removal.  Attempted to remove IUD in office today, however did not have the forceps needed to reach the IUD string.  We will have her come back just for IUD removal.  Educated her not to start the birth control patch until after her IUD is removed.  We will call her to schedule when supplies come in. ?

## 2021-12-25 NOTE — Assessment & Plan Note (Signed)
Chronic, stable.  She is not on any medications and her symptoms are very well controlled.  Follow-up with any concerns. ?

## 2022-01-18 ENCOUNTER — Encounter: Payer: Self-pay | Admitting: Nurse Practitioner

## 2022-01-18 ENCOUNTER — Ambulatory Visit (INDEPENDENT_AMBULATORY_CARE_PROVIDER_SITE_OTHER): Payer: Self-pay | Admitting: Nurse Practitioner

## 2022-01-18 VITALS — BP 110/70 | HR 72 | Temp 97.8°F | Wt 188.8 lb

## 2022-01-18 DIAGNOSIS — Z30432 Encounter for removal of intrauterine contraceptive device: Secondary | ICD-10-CM

## 2022-01-18 NOTE — Patient Instructions (Signed)
It was great to see you! ? ?Start the birth control patch and change it weekly for 3 weeks, then leave it off for 1 week. Use back up method for 2 weeks since you are taking a week in between methods of birth control.  ? ?Let's follow-up in 1 year, sooner if you have concerns. ? ?If a referral was placed today, you will be contacted for an appointment. Please note that routine referrals can sometimes take up to 3-4 weeks to process. Please call our office if you haven't heard anything after this time frame. ? ?Take care, ? ?Vance Peper, NP ? ?

## 2022-01-18 NOTE — Progress Notes (Signed)
? ?  Acute Office Visit ? ?Subjective:  ? ?  ?Patient ID: Sheena Tapia, female    DOB: 1996/04/07, 26 y.o.   MRN: [id number] ? ?Chief Complaint  ?Patient presents with  ? Contraception  ?  IUD removal  ? ? ?HPI ?Patient is in today for IUD removal.  Her IUD was set to expire at the end of this month.  We discussed last visit starting the patch for birth control. ? ?ROS ?See pertinent positives and negatives per HPI. ? ?   ?Objective:  ?  ?BP 110/70 (BP Location: Left Arm, Patient Position: Sitting, Cuff Size: Normal)   Pulse 72   Temp 97.8 ?F (36.6 ?C) (Temporal)   Wt 188 lb 12.8 oz (85.6 kg)   SpO2 99%   BMI 31.42 kg/m?  ? ? ?Physical Exam ?Vitals and nursing note reviewed. Exam conducted with a chaperone present.  ?Constitutional:   ?   General: She is not in acute distress. ?Genitourinary: ?   Vagina: Normal.  ?   Cervix: Normal.  ?Neurological:  ?   Mental Status: She is alert and oriented to person, place, and time.  ?Psychiatric:     ?   Mood and Affect: Mood normal.     ?   Behavior: Behavior normal.     ?   Thought Content: Thought content normal.     ?   Judgment: Judgment normal.  ? ? ?No results found for any visits on 01/18/22. ? ?   ?Assessment & Plan:  ? ?Problem List Items Addressed This Visit   ?None ?Visit Diagnoses   ? ? Encounter for IUD removal    -  Primary  ? Verbal consent obtained and IUD removed.  No complications.  Start birth control patch since IUD removed.  Follow-up with any concerns.  ? ?  ? ? ?No orders of the defined types were placed in this encounter. ? ? ?Return in about 1 year (around 01/19/2023) for CPE. ? ?Gerre Scull, NP ? ? ?

## 2022-04-01 ENCOUNTER — Encounter: Payer: Self-pay | Admitting: Nurse Practitioner

## 2022-04-01 ENCOUNTER — Telehealth: Payer: Self-pay | Admitting: Nurse Practitioner

## 2022-04-01 NOTE — Telephone Encounter (Signed)
Chart supports rx refill Last ov: 01/18/22 Last refill: 01/22/22  Pt needs to f/u with pharmacy for refills. There are 12 refills on file. She can call her pharmacy

## 2022-04-01 NOTE — Telephone Encounter (Signed)
Pt is requesting her BC to be refilled.  [facility] [address] in [place].

## 2022-12-20 ENCOUNTER — Other Ambulatory Visit: Payer: Self-pay | Admitting: Nurse Practitioner

## 2023-03-17 ENCOUNTER — Other Ambulatory Visit: Payer: Self-pay

## 2023-03-17 ENCOUNTER — Other Ambulatory Visit: Payer: Self-pay | Admitting: Nurse Practitioner

## 2023-03-17 ENCOUNTER — Telehealth: Payer: Self-pay | Admitting: Nurse Practitioner

## 2023-03-17 MED ORDER — NORELGESTROMIN-ETH ESTRADIOL 150-35 MCG/24HR TD PTWK: MEDICATED_PATCH | TRANSDERMAL | 0 refills | Status: DC

## 2023-03-17 NOTE — Addendum Note (Signed)
Addended by: Malena Peer Y on: 03/17/2023 04:05 PM   Modules accepted: Orders

## 2023-03-17 NOTE — Telephone Encounter (Signed)
Pt did receive her script norelgestromin-ethinyl estradiol [facility]) 150-35 MCG/24HR transdermal patch [[id number]] but it was written for 1 patch. There are 3 in a box and she usually has it written for 3 months supply. She needs this for tomorrow.

## 2023-03-17 NOTE — Telephone Encounter (Signed)
I called patient an explained that the previous message said that she was out of town and requested 1 patch that she forgot her Rx at home. The provider Oran Rein) was ok with this. Patient said that the pharmacy will not split a pack and Rx needs to be for 1 month supply.  1 month supply to be sent to pharmacy. Patient aware.

## 2023-03-17 NOTE — Telephone Encounter (Signed)
Pt is at [facility] and forgot her birth control patch. She will be there until this Monday 03/21/23. She would like a patch [facility] to [facility]  Address: [address] [place], [place] [address]  Phone: [phone]. Please advise pt at [phone]

## 2023-03-17 NOTE — Telephone Encounter (Signed)
Patient notified that Rx sent to pharmacy requested and that insurance my not cover it.

## 2023-09-28 NOTE — L&D Delivery Note (Signed)
 Delivery Note  First Stage: Labor onset: 0530 Augmentation : cytotec , pitocin  Analgesia /Anesthesia intrapartum: epidural SROM at 0530  Second Stage: Complete dilation at 2135 Onset of pushing at 2142 FHR second stage Category II, moderate variability reassuring, intermittent variable decelerations  Delivery of a viable female infant 06/07/2024 at 2222 by Edsel Blush, CNM. delivery of fetal head in OA position with restitution to ROA. No nuchal cord;  Anterior then posterior shoulders delivered easily with gentle downward traction. Baby placed on mom's chest, and attended to by peds.  Cord double clamped after cessation of pulsation, cut by FOB Cord blood sample collected   Third Stage: Placenta delivered Keren intact with 3 VC @ 2231 Placenta disposition: discarded Uterine tone firm / bleeding scant  1st degree laceration identified  Anesthesia for repair: epidural Repair 2-0 Vicryl Est. Blood Loss (mL):  Complications: none  Mom to postpartum.  Baby to Couplet care / Skin to Skin.  Newborn: Birth Weight: TBD, infant skin-to-skin  Apgar Scores: 9, 9 Feeding planned: pumped breastmilk

## 2023-12-29 DIAGNOSIS — O0993 Supervision of high risk pregnancy, unspecified, third trimester: Secondary | ICD-10-CM | POA: Insufficient documentation

## 2024-01-04 LAB — OB RESULTS CONSOLE VARICELLA ZOSTER ANTIBODY, IGG: Varicella: IMMUNE

## 2024-01-04 LAB — OB RESULTS CONSOLE RPR: RPR: NONREACTIVE

## 2024-01-04 LAB — OB RESULTS CONSOLE HIV ANTIBODY (ROUTINE TESTING): HIV: NONREACTIVE

## 2024-01-04 LAB — OB RESULTS CONSOLE GC/CHLAMYDIA
Chlamydia: NEGATIVE
Neisseria Gonorrhea: NEGATIVE

## 2024-01-04 LAB — OB RESULTS CONSOLE RUBELLA ANTIBODY, IGM: Rubella: IMMUNE

## 2024-01-04 LAB — OB RESULTS CONSOLE HEPATITIS B SURFACE ANTIGEN: Hepatitis B Surface Ag: NEGATIVE

## 2024-05-09 ENCOUNTER — Encounter: Payer: Self-pay | Admitting: Nurse Practitioner

## 2024-06-07 ENCOUNTER — Inpatient Hospital Stay
Admission: EM | Admit: 2024-06-07 | Discharge: 2024-06-09 | DRG: 807 | Disposition: A | Discharge status: Home / Self Care | Payer: Self-pay

## 2024-06-07 ENCOUNTER — Inpatient Hospital Stay: Payer: Self-pay | Admitting: Anesthesiology

## 2024-06-07 ENCOUNTER — Encounter: Payer: Self-pay | Admitting: Obstetrics and Gynecology

## 2024-06-07 ENCOUNTER — Other Ambulatory Visit: Payer: Self-pay

## 2024-06-07 DIAGNOSIS — Z833 Family history of diabetes mellitus: Secondary | ICD-10-CM | POA: Diagnosis not present

## 2024-06-07 DIAGNOSIS — O42913 Preterm premature rupture of membranes, unspecified as to length of time between rupture and onset of labor, third trimester: Secondary | ICD-10-CM | POA: Diagnosis present

## 2024-06-07 DIAGNOSIS — O4290 Premature rupture of membranes, unspecified as to length of time between rupture and onset of labor, unspecified weeks of gestation: Secondary | ICD-10-CM | POA: Diagnosis present

## 2024-06-07 DIAGNOSIS — Z3A36 36 weeks gestation of pregnancy: Secondary | ICD-10-CM | POA: Diagnosis not present

## 2024-06-07 DIAGNOSIS — G43109 Migraine with aura, not intractable, without status migrainosus: Secondary | ICD-10-CM | POA: Diagnosis present

## 2024-06-07 DIAGNOSIS — O99210 Obesity complicating pregnancy, unspecified trimester: Secondary | ICD-10-CM | POA: Diagnosis present

## 2024-06-07 DIAGNOSIS — F419 Anxiety disorder, unspecified: Secondary | ICD-10-CM | POA: Diagnosis present

## 2024-06-07 DIAGNOSIS — O99214 Obesity complicating childbirth: Secondary | ICD-10-CM | POA: Diagnosis present

## 2024-06-07 DIAGNOSIS — E66813 Obesity, class 3: Secondary | ICD-10-CM | POA: Diagnosis present

## 2024-06-07 DIAGNOSIS — O26893 Other specified pregnancy related conditions, third trimester: Secondary | ICD-10-CM | POA: Diagnosis present

## 2024-06-07 DIAGNOSIS — O42919 Preterm premature rupture of membranes, unspecified as to length of time between rupture and onset of labor, unspecified trimester: Secondary | ICD-10-CM | POA: Diagnosis present

## 2024-06-07 LAB — WET PREP, GENITAL
Clue Cells Wet Prep HPF POC: NONE SEEN
Sperm: NONE SEEN
Trich, Wet Prep: NONE SEEN
WBC, Wet Prep HPF POC: <10 (ref <10)
Yeast Wet Prep HPF POC: POSITIVE — AB

## 2024-06-07 LAB — URINALYSIS, ROUTINE W REFLEX MICROSCOPIC
Bilirubin Urine: NEGATIVE
Glucose, UA: NEGATIVE mg/dL
Hgb urine dipstick: NEGATIVE
Ketones, ur: NEGATIVE mg/dL
Leukocytes,Ua: NEGATIVE
Nitrite: NEGATIVE
Protein, ur: NEGATIVE mg/dL
Specific Gravity, Urine: 1.016 (ref 1.005–1.030)
pH: 6 (ref 5.0–8.0)

## 2024-06-07 LAB — TYPE AND SCREEN
ABO/RH(D): O POS
Antibody Screen: NEGATIVE

## 2024-06-07 LAB — CBC
HCT: 43.6 % (ref 36.0–46.0)
Hemoglobin: 14.8 g/dL (ref 12.0–15.0)
MCH: 30.6 pg (ref 26.0–34.0)
MCHC: 33.9 g/dL (ref 30.0–36.0)
MCV: 90.3 fL (ref 80.0–100.0)
Platelets: 234 K/uL (ref 150–400)
RBC: 4.83 MIL/uL (ref 3.87–5.11)
RDW: 12.3 % (ref 11.5–15.5)
WBC: 12.7 K/uL — ABNORMAL HIGH (ref 4.0–10.5)
nRBC: 0 % (ref 0.0–0.2)

## 2024-06-07 LAB — RUPTURE OF MEMBRANE (ROM)PLUS: Rom Plus: POSITIVE

## 2024-06-07 LAB — ABO/RH: ABO/RH(D): O POS

## 2024-06-07 LAB — GROUP B STREP BY PCR: Group B strep by PCR: NEGATIVE

## 2024-06-07 MED ORDER — EPHEDRINE 5 MG/ML INJ: 10.0000 mg | INTRAVENOUS | Status: DC | PRN

## 2024-06-07 MED ORDER — FLUCONAZOLE 50 MG PO TABS
150.0000 mg | ORAL_TABLET | Freq: Once | ORAL | Status: AC
  Administered 2024-06-07: 150 mg via ORAL
  Filled 2024-06-07: qty 1

## 2024-06-07 MED ORDER — PENICILLIN G POT IN DEXTROSE 60000 UNIT/ML IV SOLN: 3.0000 10*6.[IU] | INTRAVENOUS | Status: DC

## 2024-06-07 MED ORDER — PHENYLEPHRINE 80 MCG/ML (10ML) SYRINGE FOR IV PUSH (FOR BLOOD PRESSURE SUPPORT): 80.0000 ug | PREFILLED_SYRINGE | INTRAVENOUS | Status: DC | PRN

## 2024-06-07 MED ORDER — FENTANYL CITRATE (PF) 100 MCG/2ML IJ SOLN: 50.0000 ug | INTRAMUSCULAR | Status: DC | PRN

## 2024-06-07 MED ORDER — SODIUM CHLORIDE 0.9 % IV SOLN
5.0000 10*6.[IU] | Freq: Once | INTRAVENOUS | Status: DC
  Administered 2024-06-07: 5 10*6.[IU] via INTRAVENOUS
  Filled 2024-06-07: qty 5

## 2024-06-07 MED ORDER — OXYTOCIN-SODIUM CHLORIDE 30-0.9 UT/500ML-% IV SOLN
1.0000 m[IU]/min | INTRAVENOUS | Status: DC
  Administered 2024-06-07: 2 m[IU]/min via INTRAVENOUS
  Filled 2024-06-07: qty 500

## 2024-06-07 MED ORDER — LACTATED RINGERS IV SOLN
500.0000 mL | INTRAVENOUS | Status: DC | PRN
  Administered 2024-06-07: 500 mL via INTRAVENOUS

## 2024-06-07 MED ORDER — TERBUTALINE SULFATE 1 MG/ML IJ SOLN: 0.2500 mg | Freq: Once | INTRAMUSCULAR | Status: DC | PRN

## 2024-06-07 MED ORDER — ACETAMINOPHEN 325 MG PO TABS
650.0000 mg | ORAL_TABLET | ORAL | Status: DC | PRN
  Administered 2024-06-07: 650 mg via ORAL
  Filled 2024-06-07: qty 2

## 2024-06-07 MED ORDER — LIDOCAINE HCL (PF) 1 % IJ SOLN
30.0000 mL | INTRAMUSCULAR | Status: DC | PRN
  Filled 2024-06-07: qty 30

## 2024-06-07 MED ORDER — LACTATED RINGERS IV SOLN: 500.0000 mL | Freq: Once | INTRAVENOUS | Status: DC

## 2024-06-07 MED ORDER — ONDANSETRON HCL 4 MG/2ML IJ SOLN
4.0000 mg | Freq: Four times a day (QID) | INTRAMUSCULAR | Status: DC | PRN
  Administered 2024-06-07: 4 mg via INTRAVENOUS
  Filled 2024-06-07: qty 2

## 2024-06-07 MED ORDER — SOD CITRATE-CITRIC ACID 500-334 MG/5ML PO SOLN: 30.0000 mL | ORAL | Status: DC | PRN

## 2024-06-07 MED ORDER — OXYTOCIN-SODIUM CHLORIDE 30-0.9 UT/500ML-% IV SOLN: 2.5000 [IU]/h | INTRAVENOUS | Status: DC

## 2024-06-07 MED ORDER — LIDOCAINE-EPINEPHRINE (PF) 1.5 %-1:200000 IJ SOLN
INTRAMUSCULAR | Status: DC | PRN
  Administered 2024-06-07: 3 mL via EPIDURAL

## 2024-06-07 MED ORDER — MISOPROSTOL 25 MCG QUARTER TABLET
50.0000 ug | ORAL_TABLET | ORAL | Status: DC | PRN
  Administered 2024-06-07: 50 ug via ORAL
  Filled 2024-06-07: qty 1

## 2024-06-07 MED ORDER — FENTANYL-BUPIVACAINE-NACL 0.5-0.125-0.9 MG/250ML-% EP SOLN
EPIDURAL | Status: AC
  Filled 2024-06-07: qty 250

## 2024-06-07 MED ORDER — OXYTOCIN BOLUS FROM INFUSION
333.0000 mL | Freq: Once | INTRAVENOUS | Status: AC
  Administered 2024-06-07: 333 mL via INTRAVENOUS

## 2024-06-07 MED ORDER — MISOPROSTOL 200 MCG PO TABS
ORAL_TABLET | ORAL | Status: AC
  Filled 2024-06-07: qty 4

## 2024-06-07 MED ORDER — DIPHENHYDRAMINE HCL 50 MG/ML IJ SOLN: 12.5000 mg | INTRAMUSCULAR | Status: DC | PRN

## 2024-06-07 MED ORDER — FENTANYL-BUPIVACAINE-NACL 0.5-0.125-0.9 MG/250ML-% EP SOLN
12.0000 mL/h | EPIDURAL | Status: DC | PRN
  Administered 2024-06-07: 12 mL/h via EPIDURAL

## 2024-06-07 MED ORDER — LACTATED RINGERS IV SOLN
INTRAVENOUS | Status: DC
  Administered 2024-06-07 (×2): 1000 mL via INTRAVENOUS

## 2024-06-07 MED ORDER — LIDOCAINE HCL (PF) 1 % IJ SOLN
INTRAMUSCULAR | Status: DC | PRN
  Administered 2024-06-07: 3 mL

## 2024-06-07 NOTE — Anesthesia Procedure Notes (Signed)
 Epidural Patient location during procedure: OB Start time: 06/07/2024 5:40 PM End time: 06/07/2024 6:00 PM  Staffing Anesthesiologist: Vicci Camellia Glatter, MD Performed: anesthesiologist   Preanesthetic Checklist Completed: patient identified, IV checked, site marked, risks and benefits discussed, surgical consent, monitors and equipment checked, pre-op evaluation and timeout performed  Epidural Patient position: sitting Prep: ChloraPrep Patient monitoring: heart rate, continuous pulse ox and blood pressure Approach: midline Location: L3-L4 Injection technique: LOR saline  Needle:  Needle type: Tuohy  Needle gauge: 18 G Needle length: 9 cm Needle insertion depth: 7 cm Catheter type: closed end Catheter size: 20 Guage Catheter at skin depth: 12 cm Test dose: negative and 1.5% lidocaine  with Epi 1:200 K  Assessment Events: blood not aspirated, no cerebrospinal fluid, injection not painful, no injection resistance and paresthesia  Additional Notes One short duration left paresthesia noted with catheter insertion. Reason for block:procedure for pain

## 2024-06-07 NOTE — Progress Notes (Signed)
 Labor Progress Note  Sheena Tapia is a 28 y.o. G1P0000 at [redacted]w[redacted]d by LMP admitted for PPROM  Subjective: she reports feeling pressure in her urinary catheter  Objective: BP (!) 95/54 (BP Location: Left Arm)   Pulse 77   Temp 97.9 F (36.6 C) (Oral)   Resp 16   Ht 5' 5 (1.651 m)   Wt 115.7 kg   SpO2 99%   BMI 42.43 kg/m  Notable VS details: reviewed  Fetal Assessment: FHT:  FHR: 140 bpm, variability: moderate,  accelerations:  Present,  decelerations:  Absent Category/reactivity:  Category I UC:   regular, every 2-4 minutes SVE:    Dilation: 9.5cm  Effacement: 100%  Station:  +2  Consistency: soft  Position: anterior  Membrane status:SROM @ 0530 Amniotic color: clear  Labs: Lab Results  Component Value Date   WBC 12.7 (H) 06/07/2024   HGB 14.8 06/07/2024   HCT 43.6 06/07/2024   MCV 90.3 06/07/2024   PLT 234 06/07/2024    Assessment / Plan: 28 year old G1P0 at [redacted]w[redacted]d with PPROM  Labor: Excellent labor progress, pitocin  at 38mu/min, anticipate second stage soon Preeclampsia:  no signs of preeclampsia Fetal Wellbeing:  Category I Pain Control:  Epidural I/D:  GBS negative by PCR, PPROM 16hrs Anticipated MOD:  NSVD  Edsel Charlies Blush, CNM 06/07/2024, 8:56 PM

## 2024-06-07 NOTE — Progress Notes (Signed)
 Labor Progress Note  Sheena Tapia is a 28 y.o. G1P0000 at [redacted]w[redacted]d by LMP admitted for PPROM  Subjective: she is requesting an epidural, reports contractions are painful  Objective: Temp 98.3 F (36.8 C) (Oral)   Resp 16   Ht 5' 5 (1.651 m)   Wt 115.7 kg   BMI 42.43 kg/m  Notable VS details: reviewed  Fetal Assessment: FHT:  FHR: 150 bpm, variability: moderate,  accelerations:  Present,  decelerations:  Absent Category/reactivity:  Category I UC:   regular, every 3-4 minutes SVE:    Dilation: 2cm  Effacement: 90%  Station:  -1  Consistency: soft  Position: middle  Membrane status:SROM @ 0530 Amniotic color: clear  Labs: Lab Results  Component Value Date   WBC 12.7 (H) 06/07/2024   HGB 14.8 06/07/2024   HCT 43.6 06/07/2024   MCV 90.3 06/07/2024   PLT 234 06/07/2024    Assessment / Plan: 28 year old G1P0 at [redacted]w[redacted]d with PPROM  Labor: PPROM 12hrs ago, received 50mcg oral cytotec  at 1259, good labor progress and contractions present. Will start pitocin  after she gets an epidural placed per her request.  Preeclampsia:  no signs of preeclampsia Fetal Wellbeing:  Category I Pain Control:  requesting epidural I/D:  GBS negative by PCR, PPROM x 12hrs. Anticipated MOD:  NSVD  Edsel Charlies Blush, CNM 06/07/2024, 5:52 PM

## 2024-06-07 NOTE — H&P (Signed)
 OB History & Physical   History of Present Illness:  Chief Complaint:   HPI:  Sheena Tapia is a 28 y.o. G1P0000 female at [redacted]w[redacted]d dated by LMP.  She presents to L&D for leaking amniotic fluid. Around 0530 she experienced a large gush of clear fluid that soaked through her pants onto the floor below her. She reports it felt like she was involuntarily voiding and was unable to stop or control it. Afterward she experienced frequent menstrual-like cramps and pink discharge. She came to L&D and was confirmed to be PPROM'd with a positive ROM+.   Pregnancy Issues: 1. Obesity 2. Migraine with aura 3. Anxiety  Maternal Medical History:   Past Medical History:  Diagnosis Date   Childhood obesity    Menorrhagia    UTI (urinary tract infection)     Past Surgical History:  Procedure Laterality Date   TONSILECTOMY, ADENOIDECTOMY, BILATERAL MYRINGOTOMY AND TUBES      No Known Allergies  Prior to Admission medications   Medication Sig Start Date End Date Taking? Authorizing Provider  Prenatal Vit-Fe Fumarate-FA (PRENATAL MULTIVITAMIN) TABS tablet Take 1 tablet by mouth daily at 12 noon.   Yes [provider]  norelgestromin -ethinyl estradiol  (XULANE) 150-35 MCG/24HR transdermal patch APPLY 1 PATCH ONCE A WEEK Patient not taking: Reported on 06/07/2024 03/17/23   Nedra Tinnie LABOR, NP   Prenatal care site: [facility]   Social History: She  reports that she has never smoked. She has never used smokeless tobacco. She reports current alcohol use. She reports that she does not use drugs.  Family History: family history includes Diabetes in her maternal grandfather.   Review of Systems: A full review of systems was performed and negative except as noted in the HPI.     Physical Exam:  Vital Signs: Temp 98.1 F (36.7 C) (Oral)   Resp 16   Ht 5' 5 (1.651 m)   Wt 115.7 kg   BMI 42.43 kg/m  General: no acute distress.  HEENT: normocephalic, atraumatic Heart: regular  rate & rhythm.  No murmurs/rubs/gallops Lungs: clear to auscultation bilaterally, normal respiratory effort Abdomen: soft, gravid, non-tender;  EFW: 6.5lb Pelvic:   External: Normal external female genitalia  Cervix: Dilation: 1 / Effacement (%): 70 / Station: -2    Extremities: non-tender, symmetric, mild edema bilaterally.  DTRs: +2  Neurologic: Alert & oriented x 3.    Results for orders placed or performed during the hospital encounter of 06/07/24 (from the past 24 hours)  Wet prep, genital     Status: Abnormal   Collection Time: 06/07/24  8:15 AM  Result Value Ref Range   Yeast Wet Prep HPF POC POSITIVE (A) NONE SEEN   Trich, Wet Prep NONE SEEN NONE SEEN   Clue Cells Wet Prep HPF POC NONE SEEN NONE SEEN   WBC, Wet Prep HPF POC <10 <10   Sperm NONE SEEN   Urinalysis, Routine w reflex microscopic -Urine, Clean Catch     Status: Abnormal   Collection Time: 06/07/24  8:15 AM  Result Value Ref Range   Color, Urine YELLOW (A) YELLOW   APPearance CLEAR (A) CLEAR   Specific Gravity, Urine 1.016 1.005 - 1.030   pH 6.0 5.0 - 8.0   Glucose, UA NEGATIVE NEGATIVE mg/dL   Hgb urine dipstick NEGATIVE NEGATIVE   Bilirubin Urine NEGATIVE NEGATIVE   Ketones, ur NEGATIVE NEGATIVE mg/dL   Protein, ur NEGATIVE NEGATIVE mg/dL   Nitrite NEGATIVE NEGATIVE   Leukocytes,Ua NEGATIVE NEGATIVE  Rupture of Membrane (ROM) Plus     Status: None   Collection Time: 06/07/24  8:16 AM  Result Value Ref Range   Rom Plus POSITIVE     Pertinent Results:  Prenatal Labs: Blood type/Rh O positive  Antibody screen neg  Rubella Immune  Varicella Immune  RPR NR  HBsAg Neg  HIV NR  GC neg  Chlamydia neg  Genetic screening negative  1 hour GTT 88  3 hour GTT   GBS Pending by PCR   FHT: 155bpm, moderate variability, accelerations present, no decelerations, Category I tracing TOCO: no contractions noted SVE:  Dilation: 1 / Effacement (%): 70 / Station: -2    Cephalic by leopolds  No results  found.  Assessment:  Sheena Tapia is a 28 y.o. G1P0000 female at [redacted]w[redacted]d with PPROM.   Plan:  1. Admit to Labor & Delivery; consents reviewed and obtained - Dr. Leonce updated on admission and plan of care  2. Fetal Well being  - Fetal Tracing: Category I Tracing - Group B Streptococcus ppx indicated: GBS by PCR pending, giving prophylactic PCN in the meantime - Presentation: vertex confirmed by SVE   3. Routine OB: - Prenatal labs reviewed, as above - Rh positive - CBC, T&S, RPR on admit - Clear fluids, saline lock  4. Monitoring of Labor -  Contractions rare, external toco in place -  Pelvis adequate for trial of labor -  Plan for augmentation with pitocin  if needed -  Plan for continuous fetal monitoring  -  Maternal pain control as desired; requesting IVPM, regional anesthesia - Anticipate vaginal delivery  5. Post Partum Planning: - Infant feeding: pumping and providing breastmilk, open to formula while milk supply establishes - Contraception: considering IUD vs Xulane patch - Tdap: received AP - RSV: outside the season - Flu: not given  6. PPROM: - GBS by PCR collected, treating prophylactic with PCN until resulted - NICU notified of 36wk PPROM - Will augment as needed and proceed with L&D since PPROM @ 36wks - >34wks, no indication for BMZ, >32wks, no indication for magnesium  Edsel Charlies Blush, CNM 06/07/24 10:29 AM

## 2024-06-07 NOTE — Discharge Summary (Signed)
 Postpartum Discharge Summary  Patient Name: Sheena Tapia DOB: [birth year] MRN: [id number]  Date of admission: 06/07/2024 Delivery date:06/07/2024 Delivering provider: TANDA HOUSTON RENEE Date of discharge: 06/09/2024  Primary OB: [facility] LMP:No LMP recorded. EDC Estimated Date of Delivery: 07/01/24 Gestational Age at Delivery: [redacted]w[redacted]d   Admitting diagnosis: Leakage of amniotic fluid [O42.90] Preterm premature rupture of membranes (PPROM) with unknown onset of labor [O42.919] Intrauterine pregnancy: [redacted]w[redacted]d     Secondary diagnosis:   Principal Problem:   NSVD (normal spontaneous vaginal delivery) Active Problems:   Anxiety   Leakage of amniotic fluid   Preterm premature rupture of membranes (PPROM) delivered, current hospitalization   Obesity affecting pregnancy   Migraine with aura  Discharge Diagnosis: Preterm Pregnancy Delivered                                                Post partum procedures:None Augmentation:: Pitocin  and Cytotec  Complications: None Delivery Type: spontaneous vaginal delivery Anesthesia: epidural anesthesia Placenta: spontaneous To Pathology: No  Laceration: 1st degree Episiotomy: none  Prenatal Labs:  Blood type/Rh O positive  Antibody screen neg  Rubella Immune  Varicella Immune  RPR NR  HBsAg Neg  HIV NR  GC neg  Chlamydia neg  Genetic screening negative  1 hour GTT 88  3 hour GTT    GBS Negative    Hospital course: Onset of Labor With Vaginal Delivery      28 y.o. yo G1P0101 at [redacted]w[redacted]d was admitted in Latent Labor on 06/07/2024. Labor course was without complication. She progressed to 10/100/+2 and pushed almost an hour, delivering viable female infant over 1st degree laceration. Apgars 9/9. Membrane Rupture Time/Date: 5:45 AM,06/07/2024  Delivery Method:Vaginal, Spontaneous Operative Delivery:N/A Episiotomy: None Lacerations:  1st degree Patient had an uncomplicated postpartum course.  She is ambulating, tolerating a  regular diet, passing flatus, and urinating well. Patient is discharged home in stable condition on 06/09/24.  Newborn Data: [place] Birth date:06/07/2024 Birth time:10:22 PM Gender:Female Living status:Living Apgars:9 ,9  Weight:3180 g  Magnesium Sulfate received: No BMZ received: No Rhophylac:No MMR:No Varivax vaccine given: was not indicated T-DaP:Given prenatally Flu: N/A  Transfusion:No  Physical exam  Vitals:   06/08/24 1618 06/08/24 1856 06/08/24 2314 06/09/24 0841  BP: 117/65 127/70 134/85 123/77  Pulse: 82 84 83 98  Resp: 16 18 18 18   Temp: 98.5 F (36.9 C) 97.8 F (36.6 C) 98.3 F (36.8 C) 98.2 F (36.8 C)  TempSrc: Oral Oral Oral Oral  SpO2: 100% 100% 100% 99%  Weight:      Height:       General: alert, cooperative, and no distress Lochia: appropriate Uterine Fundus: firm Perineum:minimal edema/repair well approximated DVT Evaluation: No evidence of DVT seen on physical exam.  Labs: Lab Results  Component Value Date   WBC 18.7 (H) 06/08/2024   HGB 12.3 06/08/2024   HCT 35.2 (L) 06/08/2024   MCV 90.0 06/08/2024   PLT 190 06/08/2024      Latest Ref Rng & Units 12/24/2014    3:07 PM  CMP  Glucose 70 - 99 mg/dL 69   BUN 6 - 23 mg/dL 11   Creatinine 9.59 - 1.20 mg/dL 9.09   Sodium 864 - 854 mEq/L 137   Potassium 3.5 - 5.1 mEq/L 4.2   Chloride 96 - 112 mEq/L 103   CO2 19 - 32  mEq/L 28   Calcium 8.4 - 10.5 mg/dL 9.6   Total Protein 6.0 - 8.3 g/dL 7.5   Total Bilirubin 0.2 - 1.2 mg/dL 0.5   Alkaline Phos 39 - 117 U/L 56   AST 0 - 37 U/L 17   ALT 0 - 35 U/L 15    Edinburgh Score:    06/08/2024    2:46 PM  Edinburgh Postnatal Depression Scale Screening Tool  I have been able to laugh and see the funny side of things. 0  I have looked forward with enjoyment to things. 0  I have blamed myself unnecessarily when things went wrong. 0  I have been anxious or worried for no good reason. 0  I have felt scared or panicky for no good reason. 0   Things have been getting on top of me. 0  I have been so unhappy that I have had difficulty sleeping. 0  I have felt sad or miserable. 0  I have been so unhappy that I have been crying. 0  The thought of harming myself has occurred to me. 0  Edinburgh Postnatal Depression Scale Total 0    Risk assessment for postpartum VTE and prophylactic treatment: Very high risk factors: None High risk factors: None Moderate risk factors: None  Postpartum VTE prophylaxis with LMWH not indicated  After visit meds:  Allergies as of 06/09/2024   No Known Allergies      Medication List     STOP taking these medications    norelgestromin -ethinyl estradiol  150-35 MCG/24HR transdermal patch Commonly known as: Xulane       TAKE these medications    acetaminophen  325 MG tablet Commonly known as: Tylenol  Take 2 tablets (650 mg total) by mouth every 4 (four) hours as needed (for pain scale < 4).   ibuprofen  600 MG tablet Commonly known as: ADVIL  Take 1 tablet (600 mg total) by mouth every 6 (six) hours as needed for mild pain (pain score 1-3) or cramping.   prenatal multivitamin Tabs tablet Take 1 tablet by mouth daily at 12 noon.       Discharge home in stable condition Infant Feeding: Bottle and Expressed breast milk Infant Disposition:home with mother Discharge instruction: per After Visit Summary and Postpartum booklet. Activity: Advance as tolerated. Pelvic rest for 6 weeks.  Diet: routine diet Anticipated Birth Control: IUD Postpartum Appointment:6 weeks Additional Postpartum F/U: Postpartum Depression checkup Future Appointments:No future appointments. Follow up Visit:  Follow-up Information     Tanda Edsel Fuller, CNM Follow up in 2 week(s).   Specialty: Certified Nurse Midwife Why: postpartum mood check Contact information: [address] [place] [address] [phone]         Tanda Edsel Fuller, CNM. Schedule an appointment as soon as  possible for a visit in 6 week(s).   Specialty: Certified Nurse Midwife Why: postpartum visit Contact information: [address] [place] [address] [phone]                 Plan:  Sheena Tapia was discharged to home in good condition. Follow-up appointment as directed.    Signed:  Therisa CHRISTELLA Pillow, CNM 06/09/2024 9:22 AM  Therisa Pillow, CNM Certified Nurse Midwife [facility]

## 2024-06-07 NOTE — Progress Notes (Signed)
 Labor Progress Note  Sheena Tapia is a 28 y.o. G1P0000 at [redacted]w[redacted]d by LMP admitted for PPROM  Subjective: she reports occasional cramping but denies any pain or regular contractions  Objective: Temp 98.1 F (36.7 C) (Oral)   Resp 16   Ht 5' 5 (1.651 m)   Wt 115.7 kg   BMI 42.43 kg/m  Notable VS details: reviewed  Fetal Assessment: FHT:  FHR: 160 bpm, variability: moderate,  accelerations:  Present,  decelerations:  Absent Category/reactivity:  Category I UC:  rare SVE:    Did not recheck Membrane status: SROM @ 0530 Amniotic color: clear  Labs: Lab Results  Component Value Date   WBC 12.7 (H) 06/07/2024   HGB 14.8 06/07/2024   HCT 43.6 06/07/2024   MCV 90.3 06/07/2024   PLT 234 06/07/2024    Assessment / Plan: 28 year old G1P0 at [redacted]w[redacted]d with PPROM  Labor: SROM 6-7 hours ago, no contractions, rechecking not indicated. Will augment with oral cytotec  50mcg.  Preeclampsia:  no indication  Fetal Wellbeing:  Category I Pain Control:  Labor support without medications I/D:  GBS negative by PCR, PPROM x 6hrs, will minimize cervical exams Anticipated MOD:  NSVD  Edsel Charlies Blush, CNM 06/07/2024, 12:45 PM

## 2024-06-07 NOTE — OB Triage Note (Signed)
 Presents with complaint of some fluid leakage this am around 0545. States she was getting ready for work and noticed some leakage after using the bathroom, noticed some pink discharge and since then has had some cramping. Patient wore a pad in and stated that it had some pink discharge on it also. Denies intercourse in the last week. EFMs applied.

## 2024-06-07 NOTE — Anesthesia Preprocedure Evaluation (Signed)
 Anesthesia Evaluation  Patient identified by MRN, date of birth, ID band Patient awake    Reviewed: Allergy & Precautions, H&P , NPO status , Patient's Chart, lab work & pertinent test results  Airway Mallampati: II  TM Distance: >3 FB Neck ROM: full    Dental no notable dental hx.    Pulmonary neg pulmonary ROS   Pulmonary exam normal        Cardiovascular Exercise Tolerance: Good negative cardio ROS Normal cardiovascular exam     Neuro/Psych    GI/Hepatic negative GI ROS,,,  Endo/Other    Class 3 obesity  Renal/GU   negative genitourinary   Musculoskeletal   Abdominal  (+) + obese  Peds  Hematology negative hematology ROS (+)   Anesthesia Other Findings Past Medical History: No date: Childhood obesity No date: Menorrhagia No date: UTI (urinary tract infection)  Past Surgical History: No date: TONSILECTOMY, ADENOIDECTOMY, BILATERAL MYRINGOTOMY AND TUBES  BMI    Body Mass Index: 42.43 kg/m      Reproductive/Obstetrics (+) Pregnancy                              Anesthesia Physical Anesthesia Plan  ASA: 3  Anesthesia Plan: Epidural   Post-op Pain Management:    Induction:   PONV Risk Score and Plan:   Airway Management Planned:   Additional Equipment:   Intra-op Plan:   Post-operative Plan:   Informed Consent: I have reviewed the patients History and Physical, chart, labs and discussed the procedure including the risks, benefits and alternatives for the proposed anesthesia with the patient or authorized representative who has indicated his/her understanding and acceptance.       Plan Discussed with: Anesthesiologist and CRNA  Anesthesia Plan Comments:          Anesthesia Quick Evaluation

## 2024-06-07 NOTE — Progress Notes (Addendum)
 Labor Progress Note  Sheena Tapia is a 28 y.o. G1P0000 at [redacted]w[redacted]d by LMP admitted for PPROM  Subjective: called by RN to the room d/t fetal heart rate decelerations  Objective: BP 120/68   Pulse 83   Temp 98.3 F (36.8 C) (Oral)   Resp 16   Ht 5' 5 (1.651 m)   Wt 115.7 kg   SpO2 93%   BMI 42.43 kg/m  Notable VS details: reviewed  Fetal Assessment: FHT:  FHR: 150 bpm, variability: moderate,  accelerations:  Present,  decelerations:  Present prolonged deceleration to the 80s x 8 minutes with good recovery Category/reactivity:  Category II UC:   regular, every 2-3 minutes SVE:   by RN exam Dilation: 3cm  Effacement: 90%  Station:  -1  Consistency: soft  Position: middle  Membrane status:PPROM @ 0530 Amniotic color: clear  Labs: Lab Results  Component Value Date   WBC 12.7 (H) 06/07/2024   HGB 14.8 06/07/2024   HCT 43.6 06/07/2024   MCV 90.3 06/07/2024   PLT 234 06/07/2024    Assessment / Plan: 28 year old G1P0 at [redacted]w[redacted]d with PPROM  Labor: Pitocin  had been running at 72mu/min, discontinued for fetal heart rate decelerations, overall good labor progress Preeclampsia:  no signs of preeclampsia Fetal Wellbeing:  Category II, prolonged deceleration to the 80s x 8 minutes with recovery. I arrived to the room after the deceleration resolved with position changes. Patient states she felt dizzy during the deceleration and possibly from hypotension. Pitocin  discontinued and will restart once Category I tracing again. If deceleration occurs again, will place internal monitors and consider amnioinfusion.  Pain Control:  Epidural I/D:  GBS negative by PCR, PPROM x 14hrs Anticipated MOD:  NSVD  Edsel Charlies Blush, CNM 06/07/2024, 7:12 PM

## 2024-06-08 ENCOUNTER — Encounter: Payer: Self-pay | Admitting: Obstetrics and Gynecology

## 2024-06-08 LAB — CBC
HCT: 35.2 % — ABNORMAL LOW (ref 36.0–46.0)
Hemoglobin: 12.3 g/dL (ref 12.0–15.0)
MCH: 31.5 pg (ref 26.0–34.0)
MCHC: 34.9 g/dL (ref 30.0–36.0)
MCV: 90 fL (ref 80.0–100.0)
Platelets: 190 K/uL (ref 150–400)
RBC: 3.91 MIL/uL (ref 3.87–5.11)
RDW: 12.5 % (ref 11.5–15.5)
WBC: 18.7 K/uL — ABNORMAL HIGH (ref 4.0–10.5)
nRBC: 0 % (ref 0.0–0.2)

## 2024-06-08 LAB — RPR: RPR Ser Ql: NONREACTIVE

## 2024-06-08 MED ORDER — ACETAMINOPHEN 325 MG PO TABS
650.0000 mg | ORAL_TABLET | ORAL | Status: DC | PRN
  Administered 2024-06-08: 650 mg via ORAL
  Filled 2024-06-08: qty 2

## 2024-06-08 MED ORDER — ONDANSETRON HCL 4 MG PO TABS: 4.0000 mg | ORAL_TABLET | ORAL | Status: DC | PRN

## 2024-06-08 MED ORDER — BENZOCAINE-MENTHOL 20-0.5 % EX AERO
1.0000 | INHALATION_SPRAY | CUTANEOUS | Status: DC | PRN
  Filled 2024-06-08: qty 56

## 2024-06-08 MED ORDER — TETANUS-DIPHTH-ACELL PERTUSSIS 5-2.5-18.5 LF-MCG/0.5 IM SUSY: 0.5000 mL | PREFILLED_SYRINGE | Freq: Once | INTRAMUSCULAR | Status: DC

## 2024-06-08 MED ORDER — COCONUT OIL OIL
1.0000 | TOPICAL_OIL | Status: DC | PRN
  Filled 2024-06-08: qty 7.5

## 2024-06-08 MED ORDER — WITCH HAZEL-GLYCERIN EX PADS
1.0000 | MEDICATED_PAD | CUTANEOUS | Status: DC | PRN
  Filled 2024-06-08: qty 100

## 2024-06-08 MED ORDER — SENNOSIDES-DOCUSATE SODIUM 8.6-50 MG PO TABS
2.0000 | ORAL_TABLET | Freq: Every day | ORAL | Status: DC
  Administered 2024-06-08: 2 via ORAL
  Filled 2024-06-08 (×2): qty 2

## 2024-06-08 MED ORDER — DIPHENHYDRAMINE HCL 25 MG PO CAPS: 25.0000 mg | ORAL_CAPSULE | Freq: Four times a day (QID) | ORAL | Status: DC | PRN

## 2024-06-08 MED ORDER — ZOLPIDEM TARTRATE 5 MG PO TABS: 5.0000 mg | ORAL_TABLET | Freq: Every evening | ORAL | Status: DC | PRN

## 2024-06-08 MED ORDER — IBUPROFEN 600 MG PO TABS
600.0000 mg | ORAL_TABLET | Freq: Four times a day (QID) | ORAL | Status: DC
  Administered 2024-06-08 – 2024-06-09 (×6): 600 mg via ORAL
  Filled 2024-06-08 (×6): qty 1

## 2024-06-08 MED ORDER — ONDANSETRON HCL 4 MG/2ML IJ SOLN: 4.0000 mg | INTRAMUSCULAR | Status: DC | PRN

## 2024-06-08 MED ORDER — PRENATAL MULTIVITAMIN CH
1.0000 | ORAL_TABLET | Freq: Every day | ORAL | Status: DC
  Administered 2024-06-08 – 2024-06-09 (×2): 1 via ORAL
  Filled 2024-06-08 (×2): qty 1

## 2024-06-08 MED ORDER — SIMETHICONE 80 MG PO CHEW: 80.0000 mg | CHEWABLE_TABLET | ORAL | Status: DC | PRN

## 2024-06-08 MED ORDER — DIBUCAINE (PERIANAL) 1 % EX OINT: 1.0000 | TOPICAL_OINTMENT | CUTANEOUS | Status: DC | PRN

## 2024-06-08 NOTE — Plan of Care (Signed)

## 2024-06-08 NOTE — Progress Notes (Signed)
 Post Partum Day 1  Subjective: Doing well, no concerns. Ambulating without difficulty, pain managed with PO meds, tolerating regular diet, and voiding without difficulty.   No fever/chills, chest pain, shortness of breath, nausea/vomiting, or leg pain. No nipple or breast pain. No headache, visual changes, or RUQ/epigastric pain.  Objective: BP 134/83 (BP Location: Left Arm)   Pulse 71   Temp 97.7 F (36.5 C) (Oral)   Resp 18   Ht 5' 5 (1.651 m)   Wt 115.7 kg   SpO2 99%   Breastfeeding Unknown   BMI 42.43 kg/m    Physical Exam:  General: alert and cooperative Breasts: soft/nontender CV: RRR Pulm: nl effort Abdomen: soft, non-tender Uterine Fundus: firm Incision: n/a Perineum: minimal edema, repair well approximated Lochia: appropriate DVT Evaluation: No evidence of DVT seen on physical exam. Edinburgh:      No data to display           Recent Labs    06/07/24 1011 06/08/24 0616  HGB 14.8 12.3  HCT 43.6 35.2*  WBC 12.7* 18.7*  PLT 234 190    Assessment/Plan: 27 y.o. G1P0101 postpartum day # 1  1. Continue routine postpartum care  2. Infant feeding status: breast feeding -Lactation consult PRN for breastfeeding   3. Contraception plan: undecided   4. Acute blood loss anemia - clinically not significant .  -Hemodynamically stable and asymptomatic -Intervention: no intervention   5. Immunization status:   all immunizations up to date   Disposition: Continue inpatient postpartum care    LOS: 1 day   Sheena Tapia, CNM 06/08/2024, 9:02 AM

## 2024-06-08 NOTE — Anesthesia Postprocedure Evaluation (Signed)
 Anesthesia Post Note  Patient: Sheena Tapia  Procedure(s) Performed: AN AD HOC LABOR EPIDURAL  Patient location during evaluation: Mother Baby Anesthesia Type: Epidural Level of consciousness: awake and alert Pain management: pain level controlled Vital Signs Assessment: post-procedure vital signs reviewed and stable Respiratory status: spontaneous breathing, nonlabored ventilation and respiratory function stable Cardiovascular status: stable Postop Assessment: no headache, no backache and epidural receding Anesthetic complications: no   No notable events documented.   Last Vitals:  Vitals:   06/08/24 0135 06/08/24 0300  BP: 113/66 118/67  Pulse: 87 81  Resp: 16 18  Temp: 36.7 C 36.8 C  SpO2: 100% 100%    Last Pain:  Vitals:   06/08/24 0300  TempSrc: Oral  PainSc:                  [name] Lan

## 2024-06-08 NOTE — Plan of Care (Signed)

## 2024-06-08 NOTE — Lactation Note (Signed)
 This note was copied from a baby's chart. Lactation Consultation Note  Patient Name: Sheena Tapia Date: 06/08/2024 Age:30 hours Reason for consult: Initial assessment;Primapara;Exclusive pumping and bottle feeding;Late-preterm 34-36.6wks   Maternal Data Does the patient have breastfeeding experience prior to this delivery?: No  Initial assessment w/ a 57hr old baby Sheena and family.  This was a SVD.  Patient w/ a hx of anxiety and obesity.  Mom stated that her feeding goal is to exclusively pump.  Mom stated that pump has been in the room but no one set it up for her.    Feeding Mother's Current Feeding Choice: Breast Milk and Formula Nipple Type: Slow - flow  Lactation Tools Discussed/Used Tools: Pump Breast pump type: Double-Electric Breast Pump Pump Education: Setup, frequency, and cleaning;Milk Storage Reason for Pumping: Patient wants to exclusively pump Pumping frequency: 8 - 12x w/in a 24hr period  LC set up the DEBP up for patient and provided education on how to use.  Patient didn't want to pump just yet because of visitors but would once they left.  Interventions Interventions: Breast feeding basics reviewed;DEBP;Education;CDC milk storage guidelines  LC provided education on the following;  milk production expectations, hunger cues, day 1/2 wet/dirty diapers, hand expression, cluster feeding, benefits of STS and arousing infant for a feeding.  Lactation informed patient of feeding infant at least 8 or more times w/in a 24hr period but not exceeding 3hrs. Patient verbalized understanding.   Currently mom is providing primarily formula.   LC informed mom that if she does pump any EBM then feed that to infant first and follow up with formula.  Patient verbalized understanding.  Discharge Pump: Personal;DEBP (Spectra ) WIC Program: No  Consult Status Consult Status: Follow-up Follow-up type: In-patient    Kensly Bowmer S Lupita Rosales 06/08/2024, 5:38 PM

## 2024-06-09 MED ORDER — IBUPROFEN 600 MG PO TABS: 600.0000 mg | ORAL_TABLET | Freq: Four times a day (QID) | ORAL | 0 refills | Status: AC | PRN

## 2024-06-09 MED ORDER — ACETAMINOPHEN 325 MG PO TABS: 650.0000 mg | ORAL_TABLET | ORAL | Status: AC | PRN

## 2024-06-09 NOTE — Progress Notes (Signed)
 Patient discharged. Discharge instructions and prescriptions given and reviewed with patient. Patient verbalized understanding. Patient rooming in with infant who will continue to be a patient.

## 2024-06-09 NOTE — Lactation Note (Signed)
 This note was copied from a baby's chart. Lactation Consultation Note  Patient Name: Sheena Tapia Date: 06/09/2024 Age:30 hours Reason for consult: Follow-up assessment;Primapara;Exclusive pumping and bottle feeding;Late-preterm 34-36.6wks   Maternal Data Does the patient have breastfeeding experience prior to this delivery?: No  Feeding Mother's Current Feeding Choice: Breast Milk and Formula Nipple Type: Slow - flow Lactation rounds, mom continues to pump breasts and give baby her EBM, getting approx 2 cc EBM, also formula feeding baby until breastmilk volume increases, she states she does not  have any questions or concerns at present     Surgisite [place] Score                    Lactation Tools Discussed/Used Tools: Pump Breast pump type: Double-Electric Breast Pump Reason for Pumping: desires to pump and bottlefeed EBM Pumping frequency: q3h Pumped volume: 2 mL  Interventions Interventions: DEBP LC name and no written on white board Discharge Pump: DEBP;Personal WIC Program: No  Consult Status Consult Status: PRN Date: 06/09/24 Follow-up type: In-patient    Sheena Tapia 06/09/2024, 3:48 PM

## 2024-06-09 NOTE — Discharge Instructions (Signed)
 Vaginal Delivery, Care After Refer to this sheet in the next few weeks. These discharge instructions provide you with information on caring for yourself after delivery. Your caregiver may also give you specific instructions. Your treatment has been planned according to the most current medical practices available, but problems sometimes occur. Call your caregiver if you have any problems or questions after you go home. HOME CARE INSTRUCTIONS Take over-the-counter or prescription medicines only as directed by your caregiver or pharmacist. Do not drink alcohol, especially if you are breastfeeding or taking medicine to relieve pain. Do not smoke tobacco. Continue to use good perineal care. Good perineal care includes: Wiping your perineum from front to back Keeping your perineum clean. You can do sitz baths twice a day, to help keep this area clean Do not use tampons, douche or have sex until your caregiver says it is okay. Shower only and avoid sitting in submerged water, aside from sitz baths Wear a well-fitting bra that provides breast support. Eat healthy foods. Drink enough fluids to keep your urine clear or pale yellow. Eat high-fiber foods such as whole grain cereals and breads, brown rice, beans, and fresh fruits and vegetables every day. These foods may help prevent or relieve constipation. Avoid constipation with high fiber foods or medications, such as miralax or metamucil Follow your caregiver's recommendations regarding resumption of activities such as climbing stairs, driving, lifting, exercising, or traveling. Talk to your caregiver about resuming sexual activities. Resumption of sexual activities is dependent upon your risk of infection, your rate of healing, and your comfort and desire to resume sexual activity. Try to have someone help you with your household activities and your newborn for at least a few days after you leave the hospital. Rest as much as possible. Try to rest or  take a nap when your newborn is sleeping. Increase your activities gradually. Keep all of your scheduled postpartum appointments. It is very important to keep your scheduled follow-up appointments. At these appointments, your caregiver will be checking to make sure that you are healing physically and emotionally. SEEK MEDICAL CARE IF:  You are passing large clots from your vagina. Save any clots to show your caregiver. You have a foul smelling discharge from your vagina. You have trouble urinating. You are urinating frequently. You have pain when you urinate. You have a change in your bowel movements. You have increasing redness, pain, or swelling near your vaginal incision (episiotomy) or vaginal tear. You have pus draining from your episiotomy or vaginal tear. Your episiotomy or vaginal tear is separating. You have painful, hard, or reddened breasts. You have a severe headache. You have blurred vision or see spots. You feel sad or depressed. You have thoughts of hurting yourself or your newborn. You have questions about your care, the care of your newborn, or medicines. You are dizzy or light-headed. You have a rash. You have nausea or vomiting. You were breastfeeding and have not had a menstrual period within 12 weeks after you stopped breastfeeding. You are not breastfeeding and have not had a menstrual period by the 12th week after delivery. You have a fever. SEEK IMMEDIATE MEDICAL CARE IF:  You have persistent pain. You have chest pain. You have shortness of breath. You faint. You have leg pain. You have stomach pain. Your vaginal bleeding saturates two or more sanitary pads in 1 hour. MAKE SURE YOU:  Understand these instructions. Will watch your condition. Will get help right away if you are not doing well or  get worse. Document Released: 09/10/2000 Document Revised: 01/28/2014 Document Reviewed: 05/10/2012 [facility] Patient Information 2015 [place], [place]. This  information is not intended to replace advice given to you by your health care provider. Make sure you discuss any questions you have with your health care provider.  Sitz Bath A sitz bath is a warm water bath taken in the sitting position. The water covers only the hips and butt (buttocks). We recommend using one that fits in the toilet, to help with ease of use and cleanliness. It may be used for either healing or cleaning purposes. Sitz baths are also used to relieve pain, itching, or muscle tightening (spasms). The water may contain medicine. Moist heat will help you heal and relax.  HOME CARE  Take 3 to 4 sitz baths a day. Fill the bathtub half-full with warm water. Sit in the water and open the drain a little. Turn on the warm water to keep the tub half-full. Keep the water running constantly. Soak in the water for 15 to 20 minutes. After the sitz bath, pat the affected area dry. GET HELP RIGHT AWAY IF: You get worse instead of better. Stop the sitz baths if you get worse. MAKE SURE YOU: Understand these instructions. Will watch your condition. Will get help right away if you are not doing well or get worse. Document Released: 10/21/2004 Document Revised: 06/07/2012 Document Reviewed: 01/11/2011 [facility] Patient Information 2015 [place], [place]. This information is not intended to replace advice given to you by your health care provider. Make sure you discuss any questions you have with your health care provider.
